# Patient Record
Sex: Male | Born: 1945 | Race: White | Hispanic: No | Marital: Married | Smoking: Former smoker
Health system: Southern US, Community
[De-identification: ages and names within clinical notes are randomized; demographics above are authoritative.]

## PROBLEM LIST (undated history)

## (undated) DIAGNOSIS — H3530 Unspecified macular degeneration: Secondary | ICD-10-CM

## (undated) DIAGNOSIS — J449 Chronic obstructive pulmonary disease, unspecified: Secondary | ICD-10-CM

## (undated) DIAGNOSIS — E782 Mixed hyperlipidemia: Secondary | ICD-10-CM

## (undated) DIAGNOSIS — I10 Essential (primary) hypertension: Secondary | ICD-10-CM

## (undated) DIAGNOSIS — M1990 Unspecified osteoarthritis, unspecified site: Secondary | ICD-10-CM

## (undated) DIAGNOSIS — H409 Unspecified glaucoma: Secondary | ICD-10-CM

## (undated) HISTORY — PX: COLONOSCOPY: SHX174

## (undated) HISTORY — PX: EYE SURGERY: SHX253

## (undated) HISTORY — PX: JOINT REPLACEMENT: SHX530

## (undated) HISTORY — DX: Unspecified glaucoma: H40.9

## (undated) HISTORY — PX: NASAL POLYP SURGERY: SHX186

---

## 2010-05-10 DIAGNOSIS — H3530 Unspecified macular degeneration: Secondary | ICD-10-CM

## 2010-05-10 HISTORY — DX: Unspecified macular degeneration: H35.30

## 2013-10-08 DIAGNOSIS — J449 Chronic obstructive pulmonary disease, unspecified: Secondary | ICD-10-CM

## 2013-10-08 HISTORY — DX: Chronic obstructive pulmonary disease, unspecified: J44.9

## 2013-10-19 ENCOUNTER — Other Ambulatory Visit (HOSPITAL_COMMUNITY): Payer: Self-pay | Admitting: Orthopaedic Surgery

## 2013-10-19 ENCOUNTER — Encounter (HOSPITAL_COMMUNITY): Payer: Self-pay | Admitting: Pharmacy Technician

## 2013-10-19 NOTE — Patient Instructions (Addendum)
Your procedure is scheduled on:  11/02/13  Friday  Report to [facility]- FOLLOW SIGNS TO SHORT STAY CENTER Short Stay Center at  0530      AM.   Call this number if you have problems the morning of surgery: [phone]     I AM AWARE I WILL HAVE LAB DRAW FOR BLOOD TYPING MORNING OF SURGERY   Do not eat food  Or drink :After Midnight. Thursday night   Take these medicines the morning of surgery with A SIP OF WATER:NONE   .  Contacts, dentures or partial plates, or metal hairpins  can not be worn to surgery. Your family will be responsible for glasses, dentures, hearing aides while you are in surgery  Leave suitcase in the car. After surgery it may be brought to your room.  For patients admitted to the hospital, checkout time is 11:00 AM day of  discharge.         [facility] IS NOT RESPONSIBLE FOR ANY VALUABLES                                                                  [facility] - Preparing for Surgery Before surgery, you can play an important role.  Because skin is not sterile, your skin needs to be as free of germs as possible.  You can reduce the number of germs on your skin by washing with CHG (chlorahexidine gluconate) soap before surgery.  CHG is an antiseptic cleaner which kills germs and bonds with the skin to continue killing germs even after washing. Please DO NOT use if you have an allergy to CHG or antibacterial soaps.  If your skin becomes reddened/irritated stop using the CHG and inform your nurse when you arrive at Short Stay. Do not shave (including legs and underarms) for at least 48 hours prior to the first CHG shower.  You may shave your face/neck. Please follow these instructions carefully:  1.  Shower with CHG Soap the night before surgery and the  morning of Surgery.  2.  If you choose to wash your hair, wash your hair first as usual with your  normal  shampoo.  3.  After you shampoo, rinse your hair and body thoroughly to remove  the  shampoo.                           4.  Use CHG as you would any other liquid soap.  You can apply chg directly  to the skin and wash                       Gently with a scrungie or clean washcloth.  5.  Apply the CHG Soap to your body ONLY FROM THE NECK DOWN.   Do not use on face/ open                           Wound or open sores. Avoid contact with eyes, ears mouth and genitals (private parts).                       Wash  face,  Genitals (private parts) with your normal soap.             6.  Wash thoroughly, paying special attention to the area where your surgery  will be performed.  7.  Thoroughly rinse your body with warm water from the neck down.  8.  DO NOT shower/wash with your normal soap after using and rinsing off  the CHG Soap.                9.  Pat yourself dry with a clean towel.            10.  Wear clean pajamas.            11.  Place clean sheets on your bed the night of your first shower and do not  sleep with pets. Day of Surgery : Do not apply any lotions/deodorants the morning of surgery.  Please wear clean clothes to the hospital/surgery center.  FAILURE TO FOLLOW THESE INSTRUCTIONS MAY RESULT IN THE CANCELLATION OF YOUR SURGERY PATIENT SIGNATURE_________________________________  NURSE SIGNATURE__________________________________  ________________________________________________________________________  WHAT IS A BLOOD TRANSFUSION? Blood Transfusion Information  A transfusion is the replacement of blood or some of its parts. Blood is made up of multiple cells which provide different functions.  Red blood cells carry oxygen and are used for blood loss replacement.  White blood cells fight against infection.  Platelets control bleeding.  Plasma helps clot blood.  Other blood products are available for specialized needs, such as hemophilia or other clotting disorders. BEFORE THE TRANSFUSION  Who gives blood for transfusions?   Healthy volunteers who are  fully evaluated to make sure their blood is safe. This is blood bank blood. Transfusion therapy is the safest it has ever been in the practice of medicine. Before blood is taken from a donor, a complete history is taken to make sure that person has no history of diseases nor engages in risky social behavior (examples are intravenous drug use or sexual activity with multiple partners). The donor's travel history is screened to minimize risk of transmitting infections, such as malaria. The donated blood is tested for signs of infectious diseases, such as HIV and hepatitis. The blood is then tested to be sure it is compatible with you in order to minimize the chance of a transfusion reaction. If you or a relative donates blood, this is often done in anticipation of surgery and is not appropriate for emergency situations. It takes many days to process the donated blood. RISKS AND COMPLICATIONS Although transfusion therapy is very safe and saves many lives, the main dangers of transfusion include:   Getting an infectious disease.  Developing a transfusion reaction. This is an allergic reaction to something in the blood you were given. Every precaution is taken to prevent this. The decision to have a blood transfusion has been considered carefully by your caregiver before blood is given. Blood is not given unless the benefits outweigh the risks. AFTER THE TRANSFUSION  Right after receiving a blood transfusion, you will usually feel much better and more energetic. This is especially true if your red blood cells have gotten low (anemic). The transfusion raises the level of the red blood cells which carry oxygen, and this usually causes an energy increase.  The nurse administering the transfusion will monitor you carefully for complications. HOME CARE INSTRUCTIONS  No special instructions are needed after a transfusion. You may find your energy is better. Speak with your caregiver about any limitations on  activity for underlying diseases you may have. SEEK MEDICAL CARE IF:   Your condition is not improving after your transfusion.  You develop redness or irritation at the intravenous (IV) site. SEEK IMMEDIATE MEDICAL CARE IF:  Any of the following symptoms occur over the next 12 hours:  Shaking chills.  You have a temperature by mouth above 102 F (38.9 C), not controlled by medicine.  Chest, back, or muscle pain.  People around you feel you are not acting correctly or are confused.  Shortness of breath or difficulty breathing.  Dizziness and fainting.  You get a rash or develop hives.  You have a decrease in urine output.  Your urine turns a dark color or changes to pink, red, or brown. Any of the following symptoms occur over the next 10 days:  You have a temperature by mouth above 102 F (38.9 C), not controlled by medicine.  Shortness of breath.  Weakness after normal activity.  The white part of the eye turns yellow (jaundice).  You have a decrease in the amount of urine or are urinating less often.  Your urine turns a dark color or changes to pink, red, or brown. Document Released: 04/23/2000 Document Revised: 07/19/2011 Document Reviewed: 12/11/2007 [facility] Patient Information 2014 [place], [place].  _______________________________________________________________________

## 2013-10-22 ENCOUNTER — Encounter (INDEPENDENT_AMBULATORY_CARE_PROVIDER_SITE_OTHER): Payer: Self-pay

## 2013-10-22 ENCOUNTER — Ambulatory Visit (HOSPITAL_COMMUNITY)
Admission: RE | Admit: 2013-10-22 | Discharge: 2013-10-22 | Disposition: A | Discharge status: Home / Self Care | Payer: Medicare Other | Source: Ambulatory Visit | Attending: Anesthesiology | Admitting: Anesthesiology

## 2013-10-22 ENCOUNTER — Encounter (HOSPITAL_COMMUNITY): Payer: Self-pay

## 2013-10-22 ENCOUNTER — Encounter (HOSPITAL_COMMUNITY)
Admission: RE | Admit: 2013-10-22 | Discharge: 2013-10-22 | Disposition: A | Discharge status: Home / Self Care | Payer: Medicare Other | Source: Ambulatory Visit | Attending: Orthopaedic Surgery | Admitting: Orthopaedic Surgery

## 2013-10-22 DIAGNOSIS — J8410 Pulmonary fibrosis, unspecified: Secondary | ICD-10-CM | POA: Insufficient documentation

## 2013-10-22 DIAGNOSIS — Z01810 Encounter for preprocedural cardiovascular examination: Secondary | ICD-10-CM | POA: Insufficient documentation

## 2013-10-22 DIAGNOSIS — J449 Chronic obstructive pulmonary disease, unspecified: Secondary | ICD-10-CM | POA: Insufficient documentation

## 2013-10-22 DIAGNOSIS — M47814 Spondylosis without myelopathy or radiculopathy, thoracic region: Secondary | ICD-10-CM | POA: Insufficient documentation

## 2013-10-22 DIAGNOSIS — J4489 Other specified chronic obstructive pulmonary disease: Secondary | ICD-10-CM | POA: Insufficient documentation

## 2013-10-22 DIAGNOSIS — Z01812 Encounter for preprocedural laboratory examination: Secondary | ICD-10-CM | POA: Insufficient documentation

## 2013-10-22 DIAGNOSIS — Z01818 Encounter for other preprocedural examination: Secondary | ICD-10-CM | POA: Insufficient documentation

## 2013-10-22 HISTORY — DX: Unspecified macular degeneration: H35.30

## 2013-10-22 HISTORY — DX: Chronic obstructive pulmonary disease, unspecified: J44.9

## 2013-10-22 HISTORY — DX: Mixed hyperlipidemia: E78.2

## 2013-10-22 HISTORY — DX: Unspecified osteoarthritis, unspecified site: M19.90

## 2013-10-22 HISTORY — DX: Essential (primary) hypertension: I10

## 2013-10-22 LAB — HEPATIC FUNCTION PANEL
ALBUMIN: 4.4 g/dL (ref 3.5–5.2)
ALK PHOS: 61 U/L (ref 39–117)
ALT: 25 U/L (ref 0–53)
AST: 33 U/L (ref 0–37)
BILIRUBIN TOTAL: 0.7 mg/dL (ref 0.3–1.2)
Total Protein: 7.9 g/dL (ref 6.0–8.3)

## 2013-10-22 LAB — URINALYSIS, ROUTINE W REFLEX MICROSCOPIC
Bilirubin Urine: NEGATIVE
GLUCOSE, UA: NEGATIVE mg/dL
Hgb urine dipstick: NEGATIVE
KETONES UR: NEGATIVE mg/dL
LEUKOCYTES UA: NEGATIVE
NITRITE: NEGATIVE
PH: 6 (ref 5.0–8.0)
PROTEIN: NEGATIVE mg/dL
Specific Gravity, Urine: 1.015 (ref 1.005–1.030)
Urobilinogen, UA: 1 mg/dL (ref 0.0–1.0)

## 2013-10-22 LAB — CBC
HCT: 44 % (ref 39.0–52.0)
HEMOGLOBIN: 14.9 g/dL (ref 13.0–17.0)
MCH: 31.4 pg (ref 26.0–34.0)
MCHC: 33.9 g/dL (ref 30.0–36.0)
MCV: 92.8 fL (ref 78.0–100.0)
Platelets: 348 10*3/uL (ref 150–400)
RBC: 4.74 MIL/uL (ref 4.22–5.81)
RDW: 12.3 % (ref 11.5–15.5)
WBC: 8.3 10*3/uL (ref 4.0–10.5)

## 2013-10-22 LAB — BASIC METABOLIC PANEL
BUN: 11 mg/dL (ref 6–23)
CO2: 28 mEq/L (ref 19–32)
Calcium: 9.8 mg/dL (ref 8.4–10.5)
Chloride: 96 mEq/L (ref 96–112)
Creatinine, Ser: 0.83 mg/dL (ref 0.50–1.35)
GFR calc non Af Amer: 89 mL/min — ABNORMAL LOW (ref >90)
Glucose, Bld: 101 mg/dL — ABNORMAL HIGH (ref 70–99)
POTASSIUM: 4.3 meq/L (ref 3.7–5.3)
Sodium: 137 mEq/L (ref 137–147)

## 2013-10-22 LAB — APTT: aPTT: 61 seconds — ABNORMAL HIGH (ref 24–37)

## 2013-10-22 LAB — SURGICAL PCR SCREEN
MRSA, PCR: NEGATIVE
Staphylococcus aureus: NEGATIVE

## 2013-10-22 LAB — PROTIME-INR
INR: 0.98 (ref 0.00–1.49)
Prothrombin Time: 12.8 seconds (ref 11.6–15.2)

## 2013-10-22 NOTE — Progress Notes (Signed)
LOV Dr Duane LopeAlan Ross  12/14 chart

## 2013-10-22 NOTE — Progress Notes (Signed)
10/22/13 1353  OBSTRUCTIVE SLEEP APNEA  Have you ever been diagnosed with sleep apnea through a sleep study? No  Do you snore loudly (loud enough to be heard through closed doors)?  1  Do you often feel tired, fatigued, or sleepy during the daytime? 0  Has anyone observed you stop breathing during your sleep? 0  Do you have, or are you being treated for high blood pressure? 1  BMI more than 35 kg/m2? 0  Age over 67 years old? 1  Neck circumference greater than 40 cm/16 inches? 0  Gender: 1  Obstructive Sleep Apnea Score 4  Score 4 or greater  Results sent to PCP

## 2013-10-23 ENCOUNTER — Encounter (HOSPITAL_COMMUNITY): Payer: Self-pay

## 2013-11-02 ENCOUNTER — Inpatient Hospital Stay (HOSPITAL_COMMUNITY): Payer: Medicare Other

## 2013-11-02 ENCOUNTER — Inpatient Hospital Stay (HOSPITAL_COMMUNITY): Payer: Medicare Other | Admitting: Registered Nurse

## 2013-11-02 ENCOUNTER — Encounter (HOSPITAL_COMMUNITY): Payer: Self-pay | Admitting: *Deleted

## 2013-11-02 ENCOUNTER — Inpatient Hospital Stay (HOSPITAL_COMMUNITY)
Admission: RE | Admit: 2013-11-02 | Discharge: 2013-11-04 | DRG: 470 | Disposition: A | Discharge status: Home Health | Payer: Medicare Other | Source: Ambulatory Visit | Attending: Orthopaedic Surgery | Admitting: Orthopaedic Surgery

## 2013-11-02 ENCOUNTER — Encounter (HOSPITAL_COMMUNITY): Payer: Medicare Other | Admitting: Registered Nurse

## 2013-11-02 ENCOUNTER — Encounter (HOSPITAL_COMMUNITY)
Admission: RE | Disposition: A | Discharge status: Home Health | Payer: Self-pay | Source: Ambulatory Visit | Attending: Orthopaedic Surgery

## 2013-11-02 DIAGNOSIS — M1612 Unilateral primary osteoarthritis, left hip: Secondary | ICD-10-CM

## 2013-11-02 DIAGNOSIS — Z96649 Presence of unspecified artificial hip joint: Secondary | ICD-10-CM

## 2013-11-02 DIAGNOSIS — M169 Osteoarthritis of hip, unspecified: Secondary | ICD-10-CM | POA: Diagnosis not present

## 2013-11-02 DIAGNOSIS — E782 Mixed hyperlipidemia: Secondary | ICD-10-CM | POA: Diagnosis present

## 2013-11-02 DIAGNOSIS — M1610 Unilateral primary osteoarthritis, unspecified hip: Principal | ICD-10-CM | POA: Diagnosis present

## 2013-11-02 DIAGNOSIS — J4489 Other specified chronic obstructive pulmonary disease: Secondary | ICD-10-CM | POA: Diagnosis present

## 2013-11-02 DIAGNOSIS — I10 Essential (primary) hypertension: Secondary | ICD-10-CM | POA: Diagnosis not present

## 2013-11-02 DIAGNOSIS — Z87891 Personal history of nicotine dependence: Secondary | ICD-10-CM | POA: Diagnosis not present

## 2013-11-02 DIAGNOSIS — J449 Chronic obstructive pulmonary disease, unspecified: Secondary | ICD-10-CM | POA: Diagnosis present

## 2013-11-02 DIAGNOSIS — H3530 Unspecified macular degeneration: Secondary | ICD-10-CM | POA: Diagnosis present

## 2013-11-02 HISTORY — PX: TOTAL HIP ARTHROPLASTY: SHX124

## 2013-11-02 LAB — TYPE AND SCREEN
ABO/RH(D): A NEG
Antibody Screen: NEGATIVE

## 2013-11-02 LAB — ABO/RH: ABO/RH(D): A NEG

## 2013-11-02 SURGERY — ARTHROPLASTY, HIP, TOTAL, ANTERIOR APPROACH
Anesthesia: General | Site: Hip | Laterality: Left

## 2013-11-02 MED ORDER — ASPIRIN EC 325 MG PO TBEC
325.0000 mg | DELAYED_RELEASE_TABLET | Freq: Two times a day (BID) | ORAL | Status: DC
  Administered 2013-11-02 – 2013-11-04 (×4): 325 mg via ORAL
  Filled 2013-11-02 (×6): qty 1

## 2013-11-02 MED ORDER — SODIUM CHLORIDE 0.9 % IR SOLN
Status: DC | PRN
  Administered 2013-11-02: 1000 mL

## 2013-11-02 MED ORDER — PROPOFOL 10 MG/ML IV BOLUS
INTRAVENOUS | Status: AC
  Filled 2013-11-02: qty 20

## 2013-11-02 MED ORDER — METOCLOPRAMIDE HCL 10 MG PO TABS
5.0000 mg | ORAL_TABLET | Freq: Three times a day (TID) | ORAL | Status: DC | PRN
Start: 2013-11-02 — End: 2013-11-04
  Administered 2013-11-03: 10 mg via ORAL
  Filled 2013-11-02: qty 1

## 2013-11-02 MED ORDER — DIPHENHYDRAMINE HCL 12.5 MG/5ML PO ELIX: 12.5000 mg | ORAL_SOLUTION | ORAL | Status: DC | PRN

## 2013-11-02 MED ORDER — ALUM & MAG HYDROXIDE-SIMETH 200-200-20 MG/5ML PO SUSP
30.0000 mL | ORAL | Status: DC | PRN
  Administered 2013-11-03: 30 mL via ORAL
  Filled 2013-11-02: qty 30

## 2013-11-02 MED ORDER — POLYETHYLENE GLYCOL 3350 17 G PO PACK
17.0000 g | PACK | Freq: Every day | ORAL | Status: DC | PRN
  Filled 2013-11-02: qty 1

## 2013-11-02 MED ORDER — DEXAMETHASONE SODIUM PHOSPHATE 10 MG/ML IJ SOLN
INTRAMUSCULAR | Status: AC
  Filled 2013-11-02: qty 1

## 2013-11-02 MED ORDER — ACETAMINOPHEN 10 MG/ML IV SOLN
1000.0000 mg | Freq: Once | INTRAVENOUS | Status: AC
  Administered 2013-11-02: 1000 mg via INTRAVENOUS
  Filled 2013-11-02 (×2): qty 100

## 2013-11-02 MED ORDER — ONDANSETRON HCL 4 MG/2ML IJ SOLN
4.0000 mg | Freq: Four times a day (QID) | INTRAMUSCULAR | Status: DC | PRN
  Administered 2013-11-02 – 2013-11-03 (×2): 4 mg via INTRAVENOUS
  Filled 2013-11-02 (×2): qty 2

## 2013-11-02 MED ORDER — PHENOL 1.4 % MT LIQD: 1.0000 | OROMUCOSAL | Status: DC | PRN

## 2013-11-02 MED ORDER — TRANEXAMIC ACID 100 MG/ML IV SOLN
1000.0000 mg | INTRAVENOUS | Status: AC
  Administered 2013-11-02: 1000 mg via INTRAVENOUS
  Filled 2013-11-02: qty 10

## 2013-11-02 MED ORDER — PROPOFOL 10 MG/ML IV BOLUS
INTRAVENOUS | Status: DC | PRN
  Administered 2013-11-02 (×2): 50 mg via INTRAVENOUS

## 2013-11-02 MED ORDER — METHOCARBAMOL 500 MG PO TABS
500.0000 mg | ORAL_TABLET | Freq: Four times a day (QID) | ORAL | Status: DC | PRN
  Administered 2013-11-03 – 2013-11-04 (×3): 500 mg via ORAL
  Filled 2013-11-02 (×3): qty 1

## 2013-11-02 MED ORDER — HYDROMORPHONE HCL PF 1 MG/ML IJ SOLN
1.0000 mg | INTRAMUSCULAR | Status: DC | PRN
  Administered 2013-11-02 (×2): 1 mg via INTRAVENOUS
  Filled 2013-11-02 (×2): qty 1

## 2013-11-02 MED ORDER — ACETAMINOPHEN 325 MG PO TABS: 650.0000 mg | ORAL_TABLET | Freq: Four times a day (QID) | ORAL | Status: DC | PRN

## 2013-11-02 MED ORDER — 0.9 % SODIUM CHLORIDE (POUR BTL) OPTIME
TOPICAL | Status: DC | PRN
  Administered 2013-11-02: 1000 mL

## 2013-11-02 MED ORDER — HYDROCHLOROTHIAZIDE 25 MG PO TABS
25.0000 mg | ORAL_TABLET | Freq: Every day | ORAL | Status: DC
  Administered 2013-11-02 – 2013-11-04 (×3): 25 mg via ORAL
  Filled 2013-11-02 (×3): qty 1

## 2013-11-02 MED ORDER — MEPERIDINE HCL 50 MG/ML IJ SOLN
INTRAMUSCULAR | Status: AC
  Filled 2013-11-02: qty 1

## 2013-11-02 MED ORDER — CEFAZOLIN SODIUM-DEXTROSE 2-3 GM-% IV SOLR
2.0000 g | INTRAVENOUS | Status: AC
  Administered 2013-11-02: 2 g via INTRAVENOUS

## 2013-11-02 MED ORDER — OXYCODONE HCL 5 MG PO TABS: 5.0000 mg | ORAL_TABLET | Freq: Once | ORAL | Status: DC | PRN

## 2013-11-02 MED ORDER — SODIUM CHLORIDE 0.9 % IV SOLN
INTRAVENOUS | Status: DC
  Administered 2013-11-02 – 2013-11-03 (×2): via INTRAVENOUS

## 2013-11-02 MED ORDER — FENTANYL CITRATE 0.05 MG/ML IJ SOLN
INTRAMUSCULAR | Status: AC
  Filled 2013-11-02: qty 2

## 2013-11-02 MED ORDER — ACETAMINOPHEN 650 MG RE SUPP: 650.0000 mg | Freq: Four times a day (QID) | RECTAL | Status: DC | PRN

## 2013-11-02 MED ORDER — METHOCARBAMOL 1000 MG/10ML IJ SOLN
500.0000 mg | Freq: Four times a day (QID) | INTRAVENOUS | Status: DC | PRN
  Administered 2013-11-02: 500 mg via INTRAVENOUS
  Filled 2013-11-02: qty 5

## 2013-11-02 MED ORDER — MIDAZOLAM HCL 5 MG/5ML IJ SOLN
INTRAMUSCULAR | Status: DC | PRN
  Administered 2013-11-02 (×2): 2 mg via INTRAVENOUS

## 2013-11-02 MED ORDER — MIDAZOLAM HCL 2 MG/2ML IJ SOLN
INTRAMUSCULAR | Status: AC
  Filled 2013-11-02: qty 2

## 2013-11-02 MED ORDER — DOCUSATE SODIUM 100 MG PO CAPS
100.0000 mg | ORAL_CAPSULE | Freq: Two times a day (BID) | ORAL | Status: DC
  Administered 2013-11-02 – 2013-11-04 (×5): 100 mg via ORAL
  Filled 2013-11-02 (×6): qty 1

## 2013-11-02 MED ORDER — PROMETHAZINE HCL 25 MG/ML IJ SOLN: 6.2500 mg | INTRAMUSCULAR | Status: DC | PRN

## 2013-11-02 MED ORDER — BUPIVACAINE HCL (PF) 0.5 % IJ SOLN
INTRAMUSCULAR | Status: DC | PRN
  Administered 2013-11-02: 3 mL

## 2013-11-02 MED ORDER — OXYCODONE HCL 5 MG/5ML PO SOLN
5.0000 mg | Freq: Once | ORAL | Status: DC | PRN
  Filled 2013-11-02: qty 5

## 2013-11-02 MED ORDER — LACTATED RINGERS IV SOLN
INTRAVENOUS | Status: DC | PRN
  Administered 2013-11-02 (×3): via INTRAVENOUS

## 2013-11-02 MED ORDER — ZOLPIDEM TARTRATE 5 MG PO TABS: 5.0000 mg | ORAL_TABLET | Freq: Every evening | ORAL | Status: DC | PRN

## 2013-11-02 MED ORDER — HYDROMORPHONE HCL PF 1 MG/ML IJ SOLN: 0.2500 mg | INTRAMUSCULAR | Status: DC | PRN

## 2013-11-02 MED ORDER — LOSARTAN POTASSIUM-HCTZ 100-25 MG PO TABS: 1.0000 | ORAL_TABLET | Freq: Every morning | ORAL | Status: DC

## 2013-11-02 MED ORDER — PHENYLEPHRINE HCL 10 MG/ML IJ SOLN
10.0000 mg | INTRAVENOUS | Status: DC | PRN
  Administered 2013-11-02: 15 ug/min via INTRAVENOUS

## 2013-11-02 MED ORDER — CEFAZOLIN SODIUM-DEXTROSE 2-3 GM-% IV SOLR
INTRAVENOUS | Status: AC
  Filled 2013-11-02: qty 50

## 2013-11-02 MED ORDER — MENTHOL 3 MG MT LOZG: 1.0000 | LOZENGE | OROMUCOSAL | Status: DC | PRN

## 2013-11-02 MED ORDER — CEFAZOLIN SODIUM 1-5 GM-% IV SOLN
1.0000 g | Freq: Four times a day (QID) | INTRAVENOUS | Status: AC
  Administered 2013-11-02 (×2): 1 g via INTRAVENOUS
  Filled 2013-11-02 (×2): qty 50

## 2013-11-02 MED ORDER — PROPOFOL INFUSION 10 MG/ML OPTIME
INTRAVENOUS | Status: DC | PRN
  Administered 2013-11-02: 100 ug/kg/min via INTRAVENOUS

## 2013-11-02 MED ORDER — FENTANYL CITRATE 0.05 MG/ML IJ SOLN
INTRAMUSCULAR | Status: DC | PRN
  Administered 2013-11-02: 100 ug via INTRAVENOUS

## 2013-11-02 MED ORDER — PHENYLEPHRINE HCL 10 MG/ML IJ SOLN
INTRAMUSCULAR | Status: DC | PRN
  Administered 2013-11-02 (×2): 120 ug via INTRAVENOUS

## 2013-11-02 MED ORDER — OXYCODONE HCL 5 MG PO TABS
5.0000 mg | ORAL_TABLET | ORAL | Status: DC | PRN
  Administered 2013-11-02 – 2013-11-04 (×11): 10 mg via ORAL
  Filled 2013-11-02 (×11): qty 2

## 2013-11-02 MED ORDER — MEPERIDINE HCL 50 MG/ML IJ SOLN
6.2500 mg | INTRAMUSCULAR | Status: DC | PRN
Start: 2013-11-02 — End: 2013-11-02
  Administered 2013-11-02 (×2): 6.25 mg via INTRAVENOUS

## 2013-11-02 MED ORDER — LOSARTAN POTASSIUM 50 MG PO TABS
100.0000 mg | ORAL_TABLET | Freq: Every day | ORAL | Status: DC
  Administered 2013-11-02 – 2013-11-04 (×3): 100 mg via ORAL
  Filled 2013-11-02 (×3): qty 2

## 2013-11-02 MED ORDER — ONDANSETRON HCL 4 MG PO TABS: 4.0000 mg | ORAL_TABLET | Freq: Four times a day (QID) | ORAL | Status: DC | PRN

## 2013-11-02 MED ORDER — METOCLOPRAMIDE HCL 5 MG/ML IJ SOLN
5.0000 mg | Freq: Three times a day (TID) | INTRAMUSCULAR | Status: DC | PRN
  Administered 2013-11-02: 10 mg via INTRAVENOUS
  Filled 2013-11-02: qty 2

## 2013-11-02 SURGICAL SUPPLY — 47 items
APL SKNCLS STERI-STRIP NONHPOA (GAUZE/BANDAGES/DRESSINGS) ×1
BAG SPEC THK2 15X12 ZIP CLS (MISCELLANEOUS)
BAG ZIPLOCK 12X15 (MISCELLANEOUS) IMPLANT
BENZOIN TINCTURE PRP APPL 2/3 (GAUZE/BANDAGES/DRESSINGS) ×1 IMPLANT
BLADE SAW SGTL 18X1.27X75 (BLADE) ×2 IMPLANT
CAPT HIP PF COP ×1 IMPLANT
CELLS DAT CNTRL 66122 CELL SVR (MISCELLANEOUS) ×1 IMPLANT
COVER PERINEAL POST (MISCELLANEOUS) ×2 IMPLANT
DRAPE C-ARM 42X120 X-RAY (DRAPES) ×2 IMPLANT
DRAPE STERI IOBAN 125X83 (DRAPES) ×2 IMPLANT
DRAPE U-SHAPE 47X51 STRL (DRAPES) ×6 IMPLANT
DRSG AQUACEL AG ADV 3.5X10 (GAUZE/BANDAGES/DRESSINGS) ×2 IMPLANT
DURAPREP 26ML APPLICATOR (WOUND CARE) ×2 IMPLANT
ELECT BLADE TIP CTD 4 INCH (ELECTRODE) ×2 IMPLANT
ELECT REM PT RETURN 9FT ADLT (ELECTROSURGICAL) ×2
ELECTRODE REM PT RTRN 9FT ADLT (ELECTROSURGICAL) ×1 IMPLANT
FACESHIELD WRAPAROUND (MASK) ×6 IMPLANT
FACESHIELD WRAPAROUND OR TEAM (MASK) ×4 IMPLANT
GAUZE XEROFORM 1X8 LF (GAUZE/BANDAGES/DRESSINGS) IMPLANT
GLOVE BIO SURGEON STRL SZ7.5 (GLOVE) ×2 IMPLANT
GLOVE BIO SURGEON STRL SZ8 (GLOVE) ×1 IMPLANT
GLOVE BIOGEL PI IND STRL 8 (GLOVE) ×2 IMPLANT
GLOVE BIOGEL PI IND STRL 8.5 (GLOVE) IMPLANT
GLOVE BIOGEL PI INDICATOR 8 (GLOVE) ×3
GLOVE BIOGEL PI INDICATOR 8.5 (GLOVE) ×1
GLOVE ECLIPSE 7.5 STRL STRAW (GLOVE) ×1 IMPLANT
GLOVE ECLIPSE 8.0 STRL XLNG CF (GLOVE) ×2 IMPLANT
GOWN SPEC L3 XXLG W/TWL (GOWN DISPOSABLE) ×1 IMPLANT
GOWN STRL REUS W/TWL XL LVL3 (GOWN DISPOSABLE) ×5 IMPLANT
HANDPIECE INTERPULSE COAX TIP (DISPOSABLE) ×2
HOLDER FOLEY CATH W/STRAP (MISCELLANEOUS) ×1 IMPLANT
KIT BASIN OR (CUSTOM PROCEDURE TRAY) ×2 IMPLANT
PACK TOTAL JOINT (CUSTOM PROCEDURE TRAY) ×2 IMPLANT
RETRACTOR WND ALEXIS 18 MED (MISCELLANEOUS) ×1 IMPLANT
RTRCTR WOUND ALEXIS 18CM MED (MISCELLANEOUS) ×2
SET HNDPC FAN SPRY TIP SCT (DISPOSABLE) ×1 IMPLANT
STAPLER VISISTAT 35W (STAPLE) IMPLANT
STRIP CLOSURE SKIN 1/2X4 (GAUZE/BANDAGES/DRESSINGS) ×1 IMPLANT
SUT ETHIBOND NAB CT1 #1 30IN (SUTURE) ×2 IMPLANT
SUT MNCRL AB 4-0 PS2 18 (SUTURE) ×2 IMPLANT
SUT VIC AB 0 CT1 36 (SUTURE) ×2 IMPLANT
SUT VIC AB 1 CT1 36 (SUTURE) ×2 IMPLANT
SUT VIC AB 2-0 CT1 27 (SUTURE) ×4
SUT VIC AB 2-0 CT1 TAPERPNT 27 (SUTURE) ×2 IMPLANT
TOWEL OR 17X26 10 PK STRL BLUE (TOWEL DISPOSABLE) ×2 IMPLANT
TOWEL OR NON WOVEN STRL DISP B (DISPOSABLE) ×1 IMPLANT
TRAY FOLEY CATH 16FRSI W/METER (SET/KITS/TRAYS/PACK) ×1 IMPLANT

## 2013-11-02 NOTE — Anesthesia Preprocedure Evaluation (Addendum)
Anesthesia Evaluation  Patient identified by MRN, date of birth, ID band Patient awake    Reviewed: Allergy & Precautions, H&P , NPO status , Patient's Chart, lab work & pertinent test results  Airway Mallampati: II TM Distance: >3 FB Neck ROM: Full    Dental  (+) Dental Advisory Given   Pulmonary COPDformer smoker,  breath sounds clear to auscultation        Cardiovascular hypertension, Pt. on medications Rhythm:Regular Rate:Normal     Neuro/Psych negative neurological ROS  negative psych ROS   GI/Hepatic negative GI ROS, Neg liver ROS,   Endo/Other  negative endocrine ROS  Renal/GU negative Renal ROS     Musculoskeletal negative musculoskeletal ROS (+)   Abdominal   Peds  Hematology negative hematology ROS (+)   Anesthesia Other Findings   Reproductive/Obstetrics                          Anesthesia Physical Anesthesia Plan  ASA: III  Anesthesia Plan: Spinal   Post-op Pain Management:    Induction:   Airway Management Planned:   Additional Equipment:   Intra-op Plan:   Post-operative Plan:   Informed Consent: I have reviewed the patients History and Physical, chart, labs and discussed the procedure including the risks, benefits and alternatives for the proposed anesthesia with the patient or authorized representative who has indicated his/her understanding and acceptance.   Dental advisory given  Plan Discussed with: CRNA  Anesthesia Plan Comments: (Discussed spinal risk with patient. Isolated prolonged PTT. Other coags WNL. Discussed minimal risk of bleeding. Pt accepts and elects spinal.)      Anesthesia Quick Evaluation

## 2013-11-02 NOTE — Anesthesia Postprocedure Evaluation (Signed)
Anesthesia Post Note  Patient: Juan GalloFred D [place]  Procedure(s) Performed: Procedure(s) (LRB): LEFT TOTAL HIP ARTHROPLASTY ANTERIOR APPROACH (Left)  Anesthesia type: Spinal  Patient location: PACU  Post pain: Pain level controlled  Post assessment: Post-op Vital signs reviewed  Last Vitals: BP 130/87  Pulse 72  Temp(Src) 36.5 C (Oral)  Resp 16  Ht 6' (1.829 m)  Wt 256 lb (116.121 kg)  BMI 34.71 kg/m2  SpO2 97%  Post vital signs: Reviewed  Level of consciousness: sedated  Complications: No apparent anesthesia complications

## 2013-11-02 NOTE — Brief Op Note (Signed)
11/02/2013  9:04 AM  PATIENT:  Franchot GalloFred D Lebon  68 y.o. male  PRE-OPERATIVE DIAGNOSIS:  Severe osteoarthritis left hip  POST-OPERATIVE DIAGNOSIS:  Severe osteoarthritis left hip  PROCEDURE:  Procedure(s): LEFT TOTAL HIP ARTHROPLASTY ANTERIOR APPROACH (Left)  SURGEON:  Surgeon(s) and Role:    * Kathryne Hitchhristopher Y Edoardo Laforte, MD - Primary  PHYSICIAN ASSISTANT: Rexene EdisonGil Clark, PA-C  ANESTHESIA:   spinal  EBL:  Total I/O In: 1000 [I.V.:1000] Out: -   BLOOD ADMINISTERED:none  COUNTS:  YES  TOURNIQUET:  * No tourniquets in log *  DICTATION: .Other Dictation: Dictation Number [id number]  PLAN OF CARE: Admit to inpatient   PATIENT DISPOSITION:  PACU - hemodynamically stable.   Delay start of Pharmacological VTE agent (>24hrs) due to surgical blood loss or risk of bleeding: no

## 2013-11-02 NOTE — Op Note (Signed)
NAME:  Juan Reed, [place]                    ACCOUNT NO.:  [id number]  MEDICAL RECORD NO.:  [id number]  LOCATION:  [facility]                         [facility]  PHYSICIAN:  Juan Reed, M.D.DATE OF BIRTH:  1945-07-10  DATE OF PROCEDURE:  11/02/2013 DATE OF DISCHARGE:                              OPERATIVE REPORT   PREOPERATIVE DIAGNOSIS:  Severe end-stage arthritis and degenerative joint disease, left hip.  POSTOPERATIVE DIAGNOSIS:  Severe end-stage arthritis and degenerative joint disease, left hip.  PROCEDURE:  Left total hip arthroplasty through direct anterior approach.  IMPLANTS:  DePuy Sector Gription acetabular component size 56 with apex hole eliminator guide, size 36+ 4 neutral polyethylene liners, size 14 Corail femoral component with varus offset (KLA), size 36+ 5 ceramic hip ball.  SURGEON:  Juan PandaChristopher Y. Magnus IvanBlackman, MD  ASSISTANT:  Richardean CanalGilbert Clark, PA-C  ANESTHESIA:  Spinal.  ANTIBIOTICS:  2 g IV Ancef.  BLOOD LOSS:  Less than 500 mL.  COMPLICATIONS:  None.  INDICATIONS:  Juan Reed is a 68 year old gentleman with debilitating arthritis involving his right hip.  He has known this for some years now and it has gotten where it affects his activities of daily living.  His mobility is limited, and now it is impacting his quality of life.  He has failed conservative treatment efforts and at this point, he wishes to proceed with a total hip arthroplasty.  He understands the risks of acute blood loss anemia, nerve and vessel injury, fracture, infection, dislocation, DVT.  He understands the goals are decreased pain, improved mobility, and overall improved quality of life.  PROCEDURE DESCRIPTION:  After informed consent was obtained, appropriate left hip was marked.  He was brought to the operating room.  Spinal anesthesia was obtained while he was on a stretcher.  He was then placed in supine position on a stretcher with a Foley catheter placed and then in both  feet, had traction boots applied to them.  Next, he was placed supine on the Hana fracture table with the perineal post in place and both legs in inline skeletal traction devices, but no traction applied. His left operative hip was prepped and draped with DuraPrep and sterile drapes.  A time-out was called and identified as correct patient, correct left hip.  We then made an incision inferior and posterior to the lateral anterior superior iliac spine and carried this obliquely down the leg.  I dissected down to the tensor fascia lata muscle and the tensor fascia was divided longitudinally so we could proceed with a direct anterior approach to the hip.  We cauterized the lateral femoral circumflex vessels and then placed Cobra retractors around the lateral neck and up underneath the rectus femoris around the medial neck.  We opened up the hip capsule in an  L-type format and found a large effusion and significant osteophytes around the femoral head and neck. We placed the Cobra retractors within the hip capsule.  We then made our femoral neck cut with an oscillating saw just proximal to the lesser trochanter and completed this on osteotome.  I placed a corkscrew guide in the femoral head and removed the femoral head in its  entirety and found to be devoid of cartilage.  We cleaned the acetabulum debris including remnants of acetabular labrum.  I placed a bent Hohmann medially and a Cobra retractor laterally.  We then began reaming from a size 42 reamer in 2 mm increments all the way up to a size 56 with all reamers placed under direct visualization and the last 2 reamers under direct fluoroscopy, so we could obtain our depth of reamer, our inclination and anteversion.  Once we are pleased with this, we placed the real DePuy Sector Gription acetabular component size 56.  An apex hole eliminator guide and a 36+ 4 neutral polyethylene liner for size 56 acetabular component.  Attention was then  turned to the femur with the leg externally rotated to 100 degrees extended and adducted.  We had to place a Mueller retractor medially and Hohmann retractor behind the greater trochanter.  I released the lateral joint capsule and then used a box cutting osteotome to enter the femoral canal and a rongeur to lateralize.  We then began broaching from a size 8 broach using the Corail broaching system all the way up to a size 14.  With a 14 in place, we trialed a KLA neck as a varus offset neck based on his anatomy and a 36+ 1.5 hip ball.  We reduced the hip into the acetabulum and it was stable, but he was slightly short.  We then removed all instrumentation and all trial components.  We placed the real Corail femoral component size 14 with varus offset and then a 36 +5 ceramic hip ball, reduced back in the acetabulum and was stable throughout its arc of rotation with minimal shuck.  His offset and leg lengths were measured near equal under direct fluoroscopy.  We then copiously irrigated the joint capsule and the joint with normal saline solution using pulsatile lavage.  We closed the joint capsule with interrupted #1 Ethibond suture followed by running #1 Vicryl in the tensor fascia, 0 Vicryl in deep tissue, 2-0 Vicryl in subcutaneous tissue, 4-0 Monocryl in subcuticular stitch and Steri-Strips on the skin.  An Aquacel dressing was applied.  He was taken off of the Hana table into the recovery room in stable condition.  All final counts were correct and no complications noted.  Of note, Richardean CanalGilbert Clark, PA-C assisted in the entire case.  His assistance was crucial from the beginning of the case to the end with retracting, and layered closure of the wound.     Juan Pandahristopher Y. Magnus Reed, M.D.     CYB/MEDQ  D:  11/02/2013  T:  11/02/2013  Job:  [id number]

## 2013-11-02 NOTE — Evaluation (Signed)
Physical Therapy Evaluation Patient Details Name: Juan Reed MRN: [id number] DOB: 10-15-1945 Today's Date: 11/02/2013   History of Present Illness     Clinical Impression  Pt s/p L THR presents with decreased L LE strength/ROM and post op pain limiting functional mobility.  Pt should progress to d/c home with family assist and HHPT follow up.    Follow Up Recommendations Home health PT    Equipment Recommendations  Rolling walker with 5" wheels    Recommendations for Other Services OT consult     Precautions / Restrictions Precautions Precautions: Fall Restrictions Weight Bearing Restrictions: No Other Position/Activity Restrictions: WBAT      Mobility  Bed Mobility Overal bed mobility: Needs Assistance Bed Mobility: Supine to Sit     Supine to sit: Min assist;Mod assist     General bed mobility comments: cues for sequence and use of R LE to self assist  Transfers Overall transfer level: Needs assistance Equipment used: Rolling walker (2 wheeled) Transfers: Sit to/from Stand Sit to Stand: Min assist;Mod assist         General transfer comment: cues for LE management and use of UEs to self assist  Ambulation/Gait Ambulation/Gait assistance: Min assist Ambulation Distance (Feet): 65 Feet Assistive device: Rolling walker (2 wheeled) Gait Pattern/deviations: Step-to pattern;Decreased step length - right;Decreased step length - left;Shuffle;Trunk flexed Gait velocity: mod   General Gait Details: cues for posture, position from RW and initial sequence  Stairs            Wheelchair Mobility    Modified Rankin (Stroke Patients Only)       Balance                                             Pertinent Vitals/Pain 6/10; premed, ice packs provided    Home Living Family/patient expects to be discharged to:: Private residence Living Arrangements: Spouse/significant other Available Help at Discharge: Family Type of Home:  House Home Access: Stairs to enter Entrance Stairs-Rails: Doctor, general practiceight;Left Entrance Stairs-Number of Steps: 7 Home Layout: One level Home Equipment: Cane - single point      Prior Function Level of Independence: Independent;Independent with assistive device(s)         Comments: Used cane as needed     Hand Dominance   Dominant Hand: Right    Extremity/Trunk Assessment   Upper Extremity Assessment: Overall WFL for tasks assessed           Lower Extremity Assessment: LLE deficits/detail   LLE Deficits / Details: Hip strength 2+/5 wtih AAROM at hip to 75 flex and 15 abd  Cervical / Trunk Assessment: Normal  Communication   Communication: No difficulties  Cognition Arousal/Alertness: Awake/alert Behavior During Therapy: WFL for tasks assessed/performed Overall Cognitive Status: Within Functional Limits for tasks assessed                      General Comments      Exercises Total Joint Exercises Ankle Circles/Pumps: AROM;Both;15 reps;Supine Quad Sets: AROM;Both;10 reps;Supine Heel Slides: AAROM;Left;15 reps;Supine Hip ABduction/ADduction: AAROM;Left;10 reps;Supine      Assessment/Plan    PT Assessment Patient needs continued PT services  PT Diagnosis Difficulty walking   PT Problem List Decreased strength;Decreased range of motion;Decreased activity tolerance;Decreased mobility;Decreased knowledge of use of DME;Pain;Obesity  PT Treatment Interventions DME instruction;Gait training;Stair training;Functional mobility training;Therapeutic activities;Therapeutic exercise;Patient/family education  PT Goals (Current goals can be found in the Care Plan section) Acute Rehab PT Goals Patient Stated Goal: Resume previous lifestyle with decreased pain PT Goal Formulation: With patient Time For Goal Achievement: 11/09/13 Potential to Achieve Goals: Good    Frequency 7X/week   Barriers to discharge        Co-evaluation               End of Session  Equipment Utilized During Treatment: Gait belt Activity Tolerance: Patient tolerated treatment well Patient left: in chair;with call bell/phone within reach Nurse Communication: Mobility status         Time: [id number] PT Time Calculation (min): 37 min   Charges:   PT Evaluation $Initial PT Evaluation Tier I: 1 Procedure PT Treatments $Gait Training: 8-22 mins $Therapeutic Exercise: 8-22 mins   PT G Codes:          Odella Appelhans 11/02/2013, 4:06 PM

## 2013-11-02 NOTE — Anesthesia Procedure Notes (Signed)
Spinal  Patient location during procedure: OR Staffing Anesthesiologist: Nolon Nations R Performed by: anesthesiologist  Preanesthetic Checklist Completed: patient identified, site marked, surgical consent, pre-op evaluation, timeout performed, IV checked, risks and benefits discussed and monitors and equipment checked Spinal Block Patient position: sitting Prep: Betadine Patient monitoring: heart rate, continuous pulse ox and blood pressure Approach: left paramedian Location: L2-3 Injection technique: single-shot Needle Needle type: Quincke  Needle gauge: 22 G Needle length: 9 cm Additional Notes Expiration date of kit checked and confirmed. Patient tolerated procedure well, without complications.

## 2013-11-02 NOTE — Transfer of Care (Signed)
Immediate Anesthesia Transfer of Care Note  Patient: Juan GalloFred D Reed  Procedure(s) Performed: Procedure(s): LEFT TOTAL HIP ARTHROPLASTY ANTERIOR APPROACH (Left)  Patient Location: PACU  Anesthesia Type:Spinal  Level of Consciousness: awake, alert , oriented and patient cooperative  Airway & Oxygen Therapy: Patient Spontanous Breathing and Patient connected to face mask oxygen  Post-op Assessment: Report given to PACU RN and Post -op Vital signs reviewed and stable  Post vital signs: stable  Complications: No apparent anesthesia complications  T12 spinal level

## 2013-11-02 NOTE — Progress Notes (Signed)
Utilization review completed.  

## 2013-11-02 NOTE — Progress Notes (Signed)
Physical Therapy Treatment Patient Details Name: Juan MiniumFred D Reed MRN: [id number] DOB: 06-22-1945 Today's Date: 11/02/2013    History of Present Illness      PT Comments    Pt very motivated but limited this session by N&V- RN aware  Follow Up Recommendations  Home health PT     Equipment Recommendations  Rolling walker with 5" wheels    Recommendations for Other Services OT consult     Precautions / Restrictions Precautions Precautions: Fall Restrictions Weight Bearing Restrictions: No Other Position/Activity Restrictions: WBAT    Mobility  Bed Mobility Overal bed mobility: Needs Assistance Bed Mobility: Sit to Supine     Supine to sit: Min assist;Mod assist Sit to supine: Min assist;Mod assist   General bed mobility comments: cues for sequence and use of R LE to self assist  Transfers Overall transfer level: Needs assistance Equipment used: Rolling walker (2 wheeled) Transfers: Sit to/from Stand Sit to Stand: Min assist;Mod assist         General transfer comment: cues for LE management and use of UEs to self assist  Ambulation/Gait Ambulation/Gait assistance: Min assist Ambulation Distance (Feet): 5 Feet Assistive device: Rolling walker (2 wheeled) Gait Pattern/deviations: Step-to pattern;Decreased step length - right;Decreased step length - left;Shuffle;Trunk flexed Gait velocity: mod   General Gait Details: cues for posture, position from RW and initial sequence   Stairs            Wheelchair Mobility    Modified Rankin (Stroke Patients Only)       Balance                                    Cognition Arousal/Alertness: Awake/alert Behavior During Therapy: WFL for tasks assessed/performed Overall Cognitive Status: Within Functional Limits for tasks assessed                      Exercises Total Joint Exercises Ankle Circles/Pumps: AROM;Both;15 reps;Supine Quad Sets: AROM;Both;10 reps;Supine Heel Slides:  AAROM;Left;15 reps;Supine Hip ABduction/ADduction: AAROM;Left;10 reps;Supine    General Comments        Pertinent Vitals/Pain 6/10; premed, ice packs provided    Home Living Family/patient expects to be discharged to:: Private residence Living Arrangements: Spouse/significant other Available Help at Discharge: Family Type of Home: House Home Access: Stairs to enter Entrance Stairs-Rails: Right;Left Home Layout: One level Home Equipment: Cane - single point      Prior Function Level of Independence: Independent;Independent with assistive device(s)      Comments: Used cane as needed   PT Goals (current goals can now be found in the care plan section) Acute Rehab PT Goals Patient Stated Goal: Resume previous lifestyle with decreased pain PT Goal Formulation: With patient Time For Goal Achievement: 11/09/13 Potential to Achieve Goals: Good Progress towards PT goals: Progressing toward goals    Frequency  7X/week    PT Plan Current plan remains appropriate    Co-evaluation             End of Session Equipment Utilized During Treatment: Gait belt Activity Tolerance: Other (comment) (ltd by nausea) Patient left: in bed;with call bell/phone within reach;with family/visitor present     Time: [id number] PT Time Calculation (min): 17 min  Charges:  $Gait Training: 8-22 mins $Therapeutic Exercise: 8-22 mins $Therapeutic Activity: 8-22 mins  G Codes:      BRADSHAW,HUNTER 11/02/2013, 4:17 PM

## 2013-11-02 NOTE — H&P (Signed)
TOTAL HIP ADMISSION H&P  Patient is admitted for left total hip arthroplasty.  Subjective:  Chief Complaint: left hip pain  HPI: Juan Reed Upperman, 68 y.o. male, has a history of pain and functional disability in the left hip(s) due to arthritis and patient has failed non-surgical conservative treatments for greater than 12 weeks to include NSAID's and/or analgesics, corticosteriod injections, weight reduction as appropriate and activity modification.  Onset of symptoms was gradual starting 5 years ago with gradually worsening course since that time.The patient noted no past surgery on the left hip(s).  Patient currently rates pain in the left hip at 10 out of 10 with activity. Patient has night pain, worsening of pain with activity and weight bearing, trendelenberg gait, pain that interfers with activities of daily living and pain with passive range of motion. Patient has evidence of subchondral cysts, subchondral sclerosis, periarticular osteophytes and joint space narrowing by imaging studies. This condition presents safety issues increasing the risk of fall.  There is no current active infection.  Patient Active Problem List   Diagnosis Date Noted  . Arthritis of left hip 11/02/2013   Past Medical History  Diagnosis Date  . Hypertension   . Arthritis   . Macular degeneration of both eyes 2012    receiving injections  . Hyperlipidemia, mixed   . COPD (chronic obstructive pulmonary disease) 6/15    per chest x ray    Past Surgical History  Procedure Laterality Date  . Colonoscopy      x 3  . Nasal polyp surgery      Prescriptions prior to admission  Medication Sig Dispense Refill  . acetaminophen (TYLENOL) 650 MG CR tablet Take 1,300 mg by mouth every 8 (eight) hours as needed for pain.      Marland Kitchen. acetaminophen (TYLENOL) 650 MG suppository Place 650 mg rectally every 4 (four) hours as needed (Pain).      Marland Kitchen. losartan-hydrochlorothiazide (HYZAAR) 100-25 MG per tablet Take 1 tablet by mouth  every morning.      . meloxicam (MOBIC) 15 MG tablet Take 15 mg by mouth daily.      . Multiple Vitamins-Minerals (CENTRUM SILVER ULTRA MENS PO) Take 1 tablet by mouth.      Marland Kitchen. OVER THE COUNTER MEDICATION Lutein 25mg   With Zeaxanthin  TAB dAILY      . ranibizumab (LUCENTIS) 0.5 MG/0.05ML SOLN 0.5 mg by Intravitreal route every 3 (three) months. Alternates eyes between       No Known Allergies  History  Substance Use Topics  . Smoking status: Former Smoker -- 1.00 packs/day for 10 years    Quit date: 10/23/1979  . Smokeless tobacco: Never Used  . Alcohol Use: Yes     Comment: "750 ml week(some weeks) liquor"    History reviewed. No pertinent family history.   Review of Systems  Musculoskeletal: Positive for joint pain.  All other systems reviewed and are negative.   Objective:  Physical Exam  Constitutional: He is oriented to person, place, and time. He appears well-developed and well-nourished.  HENT:  Head: Normocephalic and atraumatic.  Neck: Normal range of motion. Neck supple.  Cardiovascular: Normal rate and regular rhythm.   Respiratory: Effort normal and breath sounds normal.  GI: Soft. Bowel sounds are normal.  Musculoskeletal:       Left hip: He exhibits decreased range of motion, decreased strength and bony tenderness.  Neurological: He is alert and oriented to person, place, and time.  Skin: Skin is warm and dry.  Psychiatric: He has a normal mood and affect.    Vital signs in last 24 hours: Temp:  [97.9 F (36.6 C)] 97.9 F (36.6 C) (06/26 0519) Pulse Rate:  [93] 93 (06/26 0519) Resp:  [18] 18 (06/26 0519) BP: (173)/(94) 173/94 mmHg (06/26 0519) SpO2:  [96 %] 96 % (06/26 0519)  Labs:   There is no weight on file to calculate BMI.   Imaging Review Plain radiographs demonstrate severe degenerative joint disease of the left hip(s). The bone quality appears to be good for age and reported activity level.  Assessment/Plan:  End stage arthritis, left  hip(s)  The patient history, physical examination, clinical judgement of the provider and imaging studies are consistent with end stage degenerative joint disease of the left hip(s) and total hip arthroplasty is deemed medically necessary. The treatment options including medical management, injection therapy, arthroscopy and arthroplasty were discussed at length. The risks and benefits of total hip arthroplasty were presented and reviewed. The risks due to aseptic loosening, infection, stiffness, dislocation/subluxation,  thromboembolic complications and other imponderables were discussed.  The patient acknowledged the explanation, agreed to proceed with the plan and consent was signed. Patient is being admitted for inpatient treatment for surgery, pain control, PT, OT, prophylactic antibiotics, VTE prophylaxis, progressive ambulation and ADL's and discharge planning.The patient is planning to be discharged home with home health services

## 2013-11-03 LAB — BASIC METABOLIC PANEL
BUN: 11 mg/dL (ref 6–23)
CALCIUM: 8.7 mg/dL (ref 8.4–10.5)
CO2: 32 meq/L (ref 19–32)
CREATININE: 0.82 mg/dL (ref 0.50–1.35)
Chloride: 96 mEq/L (ref 96–112)
GFR calc Af Amer: >90 mL/min (ref >90)
GFR, EST NON AFRICAN AMERICAN: 89 mL/min — AB (ref >90)
Glucose, Bld: 147 mg/dL — ABNORMAL HIGH (ref 70–99)
Potassium: 4.8 mEq/L (ref 3.7–5.3)
SODIUM: 135 meq/L — AB (ref 137–147)

## 2013-11-03 LAB — CBC
HEMATOCRIT: 38.2 % — AB (ref 39.0–52.0)
HEMOGLOBIN: 12.7 g/dL — AB (ref 13.0–17.0)
MCH: 32.1 pg (ref 26.0–34.0)
MCHC: 33.2 g/dL (ref 30.0–36.0)
MCV: 96.5 fL (ref 78.0–100.0)
Platelets: 287 10*3/uL (ref 150–400)
RBC: 3.96 MIL/uL — AB (ref 4.22–5.81)
RDW: 12.6 % (ref 11.5–15.5)
WBC: 8.7 10*3/uL (ref 4.0–10.5)

## 2013-11-03 MED ORDER — OXYCODONE-ACETAMINOPHEN 5-325 MG PO TABS: 1.0000 | ORAL_TABLET | ORAL | Status: DC | PRN

## 2013-11-03 MED ORDER — ASPIRIN 325 MG PO TBEC: 325.0000 mg | DELAYED_RELEASE_TABLET | Freq: Two times a day (BID) | ORAL | Status: DC

## 2013-11-03 NOTE — Progress Notes (Signed)
Subjective: 1 Day Post-Op Procedure(s) (LRB): LEFT TOTAL HIP ARTHROPLASTY ANTERIOR APPROACH (Left) Patient reports pain as moderate.  Did have nausea last evening and this am.  Objective: Vital signs in last 24 hours: Temp:  [97.4 F (36.3 C)-99 F (37.2 C)] 99 F (37.2 C) (06/27 0525) Pulse Rate:  [69-94] 94 (06/27 0525) Resp:  [10-18] 16 (06/27 0733) BP: (117-145)/(72-87) 127/78 mmHg (06/27 0525) SpO2:  [94 %-100 %] 94 % (06/27 0525) Weight:  [116.121 kg (256 lb)] 116.121 kg (256 lb) (06/26 1038)  Intake/Output from previous day: 06/26 0701 - 06/27 0700 In: 4746.3 [P.O.:480; I.V.:4161.3; IV Piggyback:105] Out: 2712 [Urine:2110; Emesis/NG output:2; Blood:600] Intake/Output this shift: Total I/O In: 469.8 [P.O.:240; I.V.:229.8] Out: 400 [Urine:400]   Recent Labs  11/03/13 0450  HGB 12.7*    Recent Labs  11/03/13 0450  WBC 8.7  RBC 3.96*  HCT 38.2*  PLT 287    Recent Labs  11/03/13 0450  NA 135*  K 4.8  CL 96  CO2 32  BUN 11  CREATININE 0.82  GLUCOSE 147*  CALCIUM 8.7   No results found for this basename: LABPT, INR,  in the last 72 hours  Sensation intact distally Intact pulses distally Dorsiflexion/Plantar flexion intact Incision: scant drainage  Assessment/Plan: 1 Day Post-Op Procedure(s) (LRB): LEFT TOTAL HIP ARTHROPLASTY ANTERIOR APPROACH (Left) Up with therapy Plan for discharge tomorrow  Kathryne HitchBLACKMAN,CHRISTOPHER Y 11/03/2013, 9:53 AM

## 2013-11-03 NOTE — Discharge Instructions (Signed)
Pickup stool softener for constipation. °Weight Bearing as tolerated  °No hip precautions °Progress activities slowly °Expect hip and possible knee soreness and swelling. Apply heat or ice as needed. °Keep dressing clean dry and intact, may shower with dressing intact. On Friday remove dressing, incision can get wet. After showering apply clean dressing. °

## 2013-11-03 NOTE — Progress Notes (Signed)
Physical Therapy Treatment Patient Details Name: Juan Reed MRN: [id number] DOB: 10-Apr-1946 Today's Date: 11/03/2013    History of Present Illness L THA, anterior approach    PT Comments    Pt progressing.  Car transfers reviewed with pt and spouse  Follow Up Recommendations  Home health PT     Equipment Recommendations  Rolling walker with 5" wheels    Recommendations for Other Services OT consult     Precautions / Restrictions Precautions Precautions: Fall Restrictions Weight Bearing Restrictions: No Other Position/Activity Restrictions: WBAT    Mobility  Bed Mobility Overal bed mobility: Needs Assistance Bed Mobility: Sit to Supine       Sit to supine: Min guard   General bed mobility comments: able to use R LE to assist L LE  Transfers Overall transfer level: Needs assistance Equipment used: Rolling walker (2 wheeled) Transfers: Sit to/from Stand Sit to Stand: Min guard         General transfer comment: cues for LE management and use of UEs to self assist  Ambulation/Gait Ambulation/Gait assistance: Min assist;Min guard Ambulation Distance (Feet): 169 Feet Assistive device: Rolling walker (2 wheeled) Gait Pattern/deviations: Step-to pattern;Decreased step length - right;Decreased step length - left;Shuffle;Trunk flexed Gait velocity: mod   General Gait Details: cues for posture, position from RW and initial sequence   Stairs            Wheelchair Mobility    Modified Rankin (Stroke Patients Only)       Balance                                    Cognition Arousal/Alertness: Awake/alert Behavior During Therapy: WFL for tasks assessed/performed Overall Cognitive Status: Within Functional Limits for tasks assessed                      Exercises      General Comments        Pertinent Vitals/Pain 7/10; premed    Home Living                      Prior Function            PT Goals (current  goals can now be found in the care plan section) Acute Rehab PT Goals Patient Stated Goal: Resume previous lifestyle with decreased pain PT Goal Formulation: With patient Time For Goal Achievement: 11/09/13 Potential to Achieve Goals: Good Progress towards PT goals: Progressing toward goals    Frequency  7X/week    PT Plan Current plan remains appropriate    Co-evaluation             End of Session   Activity Tolerance: Patient tolerated treatment well Patient left: in bed;with call bell/phone within reach     Time: 8756-43321501-1524 PT Time Calculation (min): 23 min  Charges:  $Gait Training: 8-22 mins $Therapeutic Activity: 8-22 mins                    G Codes:      BRADSHAW,HUNTER 11/03/2013, 4:04 PM

## 2013-11-03 NOTE — Evaluation (Signed)
Occupational Therapy Evaluation Patient Details Name: Juan Reed MRN: [id number] DOB: [birth year] Today's Date: 11/03/2013    History of Present Illness L THA, anterior approach   Clinical Impression   Pt demonstrates decline in function and safety with ADLs and ADL mobility. Pt would benefit from acute OT services to address impairments to increase level of function and safety to return home    Follow Up Recommendations  Home health OT    Equipment Recommendations  3 in 1 bedside comode;Tub/shower seat    Recommendations for Other Services       Precautions / Restrictions Precautions Precautions: Fall Restrictions Weight Bearing Restrictions: No Other Position/Activity Restrictions: WBAT      Mobility Bed Mobility Overal bed mobility: Needs Assistance Bed Mobility: Supine to Sit     Supine to sit: Min assist Sit to supine: Min assist   General bed mobility comments: able to use R LE to assist L LE  Transfers Overall transfer level: Needs assistance Equipment used: Rolling walker (2 wheeled) Transfers: Sit to/from Stand Sit to Stand: Min assist;Min guard         General transfer comment: cues for LE management and use of UEs to self assist    Balance Overall balance assessment: Needs assistance Sitting-balance support: No upper extremity supported;Feet supported Sitting balance-Leahy Scale: Good     Standing balance support: Single extremity supported;Bilateral upper extremity supported;During functional activity Standing balance-Leahy Scale: Fair                              ADL Overall ADL's : Needs assistance/impaired     Grooming: Wash/dry hands;Wash/dry face;Oral care;Standing;Min guard;Set up   Upper Body Bathing: Set up;Sitting   Lower Body Bathing: Moderate assistance;Maximal assistance   Upper Body Dressing : Set up;Sitting   Lower Body Dressing: Moderate assistance;Maximal assistance   Toilet Transfer: Minimal  assistance;Min guard;Comfort height toilet;RW;Cueing for safety   Toileting- Clothing Manipulation and Hygiene: Moderate assistance;Cueing for safety   Tub/ Shower Transfer: 3 in 1;Minimal assistance;Grab bars;Rolling walker   Functional mobility during ADLs: Min guard;Minimal assistance;Cueing for safety General ADL Comments: Pt provided with education and demo of ADL A/E for home use     Vision  wears glasses at all times                   Perception Perception Perception Tested?: No   Praxis Praxis Praxis tested?: Not tested    Pertinent Vitals/Pain 6/10 L LE pain reported, VSS     Hand Dominance Right   Extremity/Trunk Assessment Upper Extremity Assessment Upper Extremity Assessment: Overall WFL for tasks assessed   Lower Extremity Assessment Lower Extremity Assessment: Defer to PT evaluation   Cervical / Trunk Assessment Cervical / Trunk Assessment: Normal   Communication Communication Communication: No difficulties   Cognition Arousal/Alertness: Awake/alert Behavior During Therapy: WFL for tasks assessed/performed Overall Cognitive Status: Within Functional Limits for tasks assessed                     General Comments   Pt very pleasant and cooperative                Home Living Family/patient expects to be discharged to:: Private residence Living Arrangements: Spouse/significant other Available Help at Discharge: Family Type of Home: House Home Access: Stairs to enter Secretary/administratorntrance Stairs-Number of Steps: 7 Entrance Stairs-Rails: Right;Left Home Layout: One level     Bathroom Shower/Tub: Tub/shower  unit Shower/tub characteristics: Curtain FirefighterBathroom Toilet: Standard     Home Equipment: Cane - single point          Prior Functioning/Environment Level of Independence: Independent;Independent with assistive device(s)        Comments: Used cane as needed    OT Diagnosis: Acute pain   OT Problem List: Pain;Impaired balance  (sitting and/or standing);Decreased activity tolerance;Decreased knowledge of use of DME or AE   OT Treatment/Interventions: Self-care/ADL training;Therapeutic exercise;Patient/family education;Neuromuscular education;Balance training;Therapeutic activities;DME and/or AE instruction    OT Goals(Current goals can be found in the care plan section) Acute Rehab OT Goals Patient Stated Goal: Resume previous lifestyle with decreased pain OT Goal Formulation: With patient Time For Goal Achievement: 11/10/13 Potential to Achieve Goals: Good ADL Goals Pt Will Perform Grooming: with set-up;with supervision;standing Pt Will Perform Lower Body Bathing: with mod assist;with min assist;sitting/lateral leans;sit to/from stand;with caregiver independent in assisting;with adaptive equipment Pt Will Perform Lower Body Dressing: with mod assist;with min assist;sitting/lateral leans;sit to/from stand;with caregiver independent in assisting;with adaptive equipment Pt Will Transfer to Toilet: with min guard assist;with supervision;ambulating (3 in 1 over toilet) Pt Will Perform Toileting - Clothing Manipulation and hygiene: with min assist;sit to/from stand Pt Will Perform Tub/Shower Transfer: tub bench;with min assist;with min guard assist;3 in 1 Additional ADL Goal #1: Pt will complete bed mobility min guard A - sup to sit EOB in prep for ADLs  OT Frequency: Min 2X/week   Barriers to D/C:    none                     End of Session Equipment Utilized During Treatment: Rolling walker;Other (comment) (3 in 1 over toilet)  Activity Tolerance: Patient tolerated treatment well Patient left: in chair;with call bell/phone within reach   Time: 0902-0927 OT Time Calculation (min): 25 min Charges:  OT General Charges $OT Visit: 1 Procedure OT Evaluation $Initial OT Evaluation Tier I: 1 Procedure OT Treatments $Therapeutic Activity: 8-22 mins G-Codes:    Galen ManilaSpencer, Chelbie Jarnagin Jeanette 11/03/2013, 11:12  AM

## 2013-11-03 NOTE — Care Management Note (Addendum)
  Page 2 of 2   11/03/2013     10:50:19 AM CARE MANAGEMENT NOTE 11/03/2013  Patient:  Juan Reed, Juan Reed   Account Number:  [id number]  Date Initiated:  11/03/2013  Documentation initiated by:  [facility]  Subjective/Objective Assessment:   adm: left hip pain; LEFT TOTAL HIP ARTHROPLASTY ANTERIOR APPROACH (Left)     Action/Plan:   discharge panning   Anticipated DC Date:  11/04/2013   Anticipated DC Plan:  [place]  CM consult  PCP issues      [facility] Choice  HOME HEALTH   Choice offered to / List presented to:  C-1 Patient   DME arranged  [facility]ass[facility]      DME agency  [place] arranged  [place]   Status of service:  Completed, signed off Medicare Important Message given?   (If response is "NO", the following Medicare IM given date fields will be blank) Date Medicare IM given:   Date Additional Medicare IM given:    Discharge Disposition:  [place]  Per UR Regulation:    If discussed at Long Length of Stay Meetings, dates discussed:    Comments:  11/03/13 10:30 CM met with pt to confirm choice of home health agency.  Pt presurgically set up with [facility] (according to [place] rep, [place]).  Address and contact information verified with pt.  PCP Resource list given pt.  DME rep will deliver 3n1 and rolling walker to room prior to discharge.  No other CM needs were communicated.  Mariane Masters, BSN, CM [phone].

## 2013-11-03 NOTE — Progress Notes (Signed)
Physical Therapy Treatment Patient Details Name: Juan MiniumFred D Reed MRN: [id number] DOB: 09-May-1946 Today's Date: 11/03/2013    History of Present Illness      PT Comments    Progressing but pain ltd this am  Follow Up Recommendations  Home health PT     Equipment Recommendations  Rolling walker with 5" wheels    Recommendations for Other Services OT consult     Precautions / Restrictions Precautions Precautions: Fall Restrictions Weight Bearing Restrictions: No Other Position/Activity Restrictions: WBAT    Mobility  Bed Mobility Overal bed mobility: Needs Assistance Bed Mobility: Sit to Supine;Supine to Sit     Supine to sit: Min assist Sit to supine: Min assist   General bed mobility comments: cues for sequence and use of R LE to self assist  Transfers Overall transfer level: Needs assistance Equipment used: Rolling walker (2 wheeled) Transfers: Sit to/from Stand Sit to Stand: Min assist;Min guard         General transfer comment: cues for LE management and use of UEs to self assist  Ambulation/Gait Ambulation/Gait assistance: Min assist;Min guard Ambulation Distance (Feet): 68 Feet Assistive device: Rolling walker (2 wheeled) Gait Pattern/deviations: Step-to pattern;Decreased step length - right;Decreased step length - left;Shuffle;Trunk flexed     General Gait Details: cues for posture, position from RW and initial sequence   Stairs            Wheelchair Mobility    Modified Rankin (Stroke Patients Only)       Balance                                    Cognition Arousal/Alertness: Awake/alert Behavior During Therapy: WFL for tasks assessed/performed Overall Cognitive Status: Within Functional Limits for tasks assessed                      Exercises Total Joint Exercises Ankle Circles/Pumps: AROM;Both;15 reps;Supine Quad Sets: AROM;Both;10 reps;Supine Heel Slides: AAROM;Left;15 reps;Supine Hip  ABduction/ADduction: AAROM;Left;Supine;15 reps    General Comments        Pertinent Vitals/Pain 7/10 with activity; premed, ice packs provided.    Home Living Family/patient expects to be discharged to:: Private residence Living Arrangements: Spouse/significant other Available Help at Discharge: Family Type of Home: House Home Access: Stairs to enter Entrance Stairs-Rails: Right;Left Home Layout: One level Home Equipment: Cane - single point      Prior Function Level of Independence: Independent;Independent with assistive device(s)      Comments: Used cane as needed   PT Goals (current goals can now be found in the care plan section) Acute Rehab PT Goals Patient Stated Goal: Resume previous lifestyle with decreased pain PT Goal Formulation: With patient Time For Goal Achievement: 11/09/13 Potential to Achieve Goals: Good Progress towards PT goals: Progressing toward goals    Frequency  7X/week    PT Plan Current plan remains appropriate    Co-evaluation             End of Session Equipment Utilized During Treatment: Gait belt Activity Tolerance: Patient tolerated treatment well Patient left: in bed;with call bell/phone within reach     Time: 4098-11910809-0837 PT Time Calculation (min): 28 min  Charges:  $Gait Training: 8-22 mins $Therapeutic Exercise: 8-22 mins                    G Codes:      BRADSHAW,HUNTER 11/03/2013,  9:55 AM

## 2013-11-04 LAB — CBC
HCT: 37.1 % — ABNORMAL LOW (ref 39.0–52.0)
Hemoglobin: 12.1 g/dL — ABNORMAL LOW (ref 13.0–17.0)
MCH: 30.9 pg (ref 26.0–34.0)
MCHC: 32.6 g/dL (ref 30.0–36.0)
MCV: 94.6 fL (ref 78.0–100.0)
PLATELETS: 286 10*3/uL (ref 150–400)
RBC: 3.92 MIL/uL — AB (ref 4.22–5.81)
RDW: 12.5 % (ref 11.5–15.5)
WBC: 8.8 10*3/uL (ref 4.0–10.5)

## 2013-11-04 NOTE — Plan of Care (Signed)
Problem: Discharge Progression Outcomes Goal: Anticoagulant follow-up in place Outcome: Not Applicable Date Met:  39/43/20 asa

## 2013-11-04 NOTE — Progress Notes (Signed)
Physical Therapy Treatment Patient Details Name: Juan MiniumFred Reed Date MRN: [id number] DOB: 09/05/1945 Today's Date: 11/04/2013    History of Present Illness L THA, anterior approach    PT Comments    Pt continues to progress well.  Reviewed car transfers, stairs with pt.  Follow Up Recommendations  Home health PT     Equipment Recommendations  Rolling walker with 5" wheels    Recommendations for Other Services OT consult     Precautions / Restrictions Precautions Precautions: Fall Restrictions Weight Bearing Restrictions: No Other Position/Activity Restrictions: WBAT    Mobility  Bed Mobility Overal bed mobility: Needs Assistance Bed Mobility: Supine to Sit;Sit to Supine     Supine to sit: Supervision Sit to supine: Supervision   General bed mobility comments: Pt assisted L LE with sheet out of bed and with other LE into bed  Transfers Overall transfer level: Needs assistance Equipment used: Rolling walker (2 wheeled) Transfers: Sit to/from Stand Sit to Stand: Supervision         General transfer comment: min cues for use of UEs  Ambulation/Gait Ambulation/Gait assistance: Min guard;Supervision Ambulation Distance (Feet): 222 Feet Assistive device: Rolling walker (2 wheeled) Gait Pattern/deviations: Step-to pattern;Step-through pattern;Shuffle;Trunk flexed Gait velocity: mod   General Gait Details: cues for posture, position from RW and initial sequence   Stairs Stairs: Yes Stairs assistance: Min assist Stair Management: One rail Left;Step to pattern;Forwards;With crutches Number of Stairs: 4 General stair comments: cues for sequence and foot/crutch placement  Wheelchair Mobility    Modified Rankin (Stroke Patients Only)       Balance                                    Cognition Arousal/Alertness: Awake/alert Behavior During Therapy: WFL for tasks assessed/performed Overall Cognitive Status: Within Functional Limits for tasks  assessed                      Exercises Total Joint Exercises Ankle Circles/Pumps: AROM;Both;15 reps;Supine Quad Sets: AROM;Both;10 reps;Supine Gluteal Sets: AROM;Both;10 reps;Supine Heel Slides: AAROM;Left;Supine;20 reps Hip ABduction/ADduction: AAROM;Left;Supine;20 reps    General Comments        Pertinent Vitals/Pain 4/10; premed    Home Living                      Prior Function            PT Goals (current goals can now be found in the care plan section) Acute Rehab PT Goals Patient Stated Goal: Resume previous lifestyle with decreased pain PT Goal Formulation: With patient Time For Goal Achievement: 11/09/13 Potential to Achieve Goals: Good Progress towards PT goals: Progressing toward goals    Frequency  7X/week    PT Plan Current plan remains appropriate    Co-evaluation             End of Session   Activity Tolerance: Patient tolerated treatment well Patient left: in bed;with call bell/phone within reach     Time: 0755-0840 PT Time Calculation (min): 45 min  Charges:  $Gait Training: 8-22 mins $Therapeutic Exercise: 8-22 mins $Therapeutic Activity: 8-22 mins                    G Codes:      Tudor Chandley 11/04/2013, 8:44 AM

## 2013-11-04 NOTE — Discharge Summary (Signed)
Patient ID: Juan Reed Highley MRN: [id number] DOB/AGE: 11-Jan-1946 68 y.o.  Admit date: 11/02/2013 Discharge date: 11/04/2013  Admission Diagnoses:  Principal Problem:   Arthritis of left hip Active Problems:   Status post THR (total hip replacement)   Discharge Diagnoses:  Same  Past Medical History  Diagnosis Date  . Hypertension   . Arthritis   . Macular degeneration of both eyes 2012    receiving injections  . Hyperlipidemia, mixed   . COPD (chronic obstructive pulmonary disease) 6/15    per chest x ray    Surgeries: Procedure(s): LEFT TOTAL HIP ARTHROPLASTY ANTERIOR APPROACH on 11/02/2013   Consultants:    Discharged Condition: Improved  Hospital Course: Juan Reed Linnen is an 68 y.o. male who was admitted 11/02/2013 for operative treatment ofArthritis of left hip. Patient has severe unremitting pain that affects sleep, daily activities, and work/hobbies. After pre-op clearance the patient was taken to the operating room on 11/02/2013 and underwent  Procedure(s): LEFT TOTAL HIP ARTHROPLASTY ANTERIOR APPROACH.    Patient was given perioperative antibiotics: Anti-infectives   Start     Dose/Rate Route Frequency Ordered Stop   11/02/13 1400  ceFAZolin (ANCEF) IVPB 1 g/50 mL premix     1 g 100 mL/hr over 30 Minutes Intravenous Every 6 hours 11/02/13 1043 11/02/13 2012   11/02/13 0518  ceFAZolin (ANCEF) IVPB 2 g/50 mL premix     2 g 100 mL/hr over 30 Minutes Intravenous On call to O.R. 11/02/13 0518 11/02/13 0726       Patient was given sequential compression devices, early ambulation, and chemoprophylaxis to prevent DVT.  Patient benefited maximally from hospital stay and there were no complications.    Recent vital signs: Patient Vitals for the past 24 hrs:  BP Temp Temp src Pulse Resp SpO2  11/04/13 0535 146/74 mmHg 98.5 F (36.9 C) Oral 92 18 92 %  11/03/13 2215 127/72 mmHg 99.5 F (37.5 C) Oral 90 18 92 %  11/03/13 1420 129/77 mmHg 98.5 F (36.9 C) Oral 94 18 94 %   11/03/13 1115 - - - - 18 -  11/03/13 1010 126/74 mmHg 98.8 F (37.1 C) Oral 88 16 94 %     Recent laboratory studies:  Recent Labs  11/03/13 0450 11/04/13 0442  WBC 8.7 8.8  HGB 12.7* 12.1*  HCT 38.2* 37.1*  PLT 287 286  NA 135*  --   K 4.8  --   CL 96  --   CO2 32  --   BUN 11  --   CREATININE 0.82  --   GLUCOSE 147*  --   CALCIUM 8.7  --      Discharge Medications:     Medication List    STOP taking these medications       acetaminophen 650 MG CR tablet  Commonly known as:  TYLENOL     acetaminophen 650 MG suppository  Commonly known as:  TYLENOL     meloxicam 15 MG tablet  Commonly known as:  MOBIC      TAKE these medications       aspirin 325 MG EC tablet  Take 1 tablet (325 mg total) by mouth 2 (two) times daily after a meal.     CENTRUM SILVER ULTRA MENS PO  Take 1 tablet by mouth.     losartan-hydrochlorothiazide 100-25 MG per tablet  Commonly known as:  HYZAAR  Take 1 tablet by mouth every morning.     LUCENTIS 0.5 MG/0.05ML Soln  Generic drug:  ranibizumab  0.5 mg by Intravitreal route every 3 (three) months. Alternates eyes between     OVER THE COUNTER MEDICATION  Lutein 25mg   With Zeaxanthin  TAB dAILY     oxyCODONE-acetaminophen 5-325 MG per tablet  Commonly known as:  ROXICET  Take 1-2 tablets by mouth every 4 (four) hours as needed for severe pain.        Diagnostic Studies: Dg Chest 2 View  10/22/2013   CLINICAL DATA:  Preoperative evaluation for hip surgery  EXAM: CHEST  2 VIEW  COMPARISON:  None.  FINDINGS: Cardiac shadow is within normal limits. The lungs are well aerated bilaterally. Calcified granuloma is noted in the left lung base. Mild hyperinflation is noted consistent with COPD. Degenerative changes of the thoracic spine are seen.  IMPRESSION: COPD without acute abnormality.   Electronically Signed   By: Alcide CleverMark  Lukens M.Reed.   On: 10/22/2013 16:07   Dg Hip Complete Left  11/02/2013   CLINICAL DATA:  Left total hip  replacement  EXAM: LEFT HIP - COMPLETE 2+ VIEW  COMPARISON:  None.  FINDINGS: Two C-arm images show total hip replacement on the left. Components appear well positioned without evidence of complicating feature. Other regional structures appear intact.  IMPRESSION: Good appearance following total hip replacement on the left.   Electronically Signed   By: Paulina FusiMark  Shogry M.Reed.   On: 11/02/2013 09:11   Dg Pelvis Portable  11/02/2013   CLINICAL DATA:  Left hip arthroplasty  EXAM: PORTABLE PELVIS 1-2 VIEWS  COMPARISON:  None.  FINDINGS: Total left hip arthroplasty without failure or complication. No fracture or dislocation. Postsurgical changes in the surrounding soft tissues.  Mild right hip osteoarthritis.  IMPRESSION: Total left hip arthroplasty without failure or complication. No dislocation.   Electronically Signed   By: Elige KoHetal  Patel   On: 11/02/2013 10:00   Dg Hip Portable 1 View Left  11/02/2013   CLINICAL DATA:  Left hip replacement  EXAM: PORTABLE LEFT HIP - 1 VIEW  COMPARISON:  None.  FINDINGS: Cross-table lateral view of the left hip demonstrates a total left hip arthroplasty without failure or complication. No fracture or dislocation. Postsurgical changes in the surrounding soft tissues.  IMPRESSION: Total left hip arthroplasty without failure or complication.   Electronically Signed   By: Elige KoHetal  Patel   On: 11/02/2013 09:59   Dg C-arm 1-60 Min-no Report  11/02/2013   CLINICAL DATA: left anterior hip   C-ARM 1-60 MINUTES  Fluoroscopy was utilized by the requesting physician.  No radiographic  interpretation.     Disposition: Stable / improved      Discharge Instructions   Weight bearing as tolerated    Complete by:  As directed            Follow-up Information   [facility] ([facility])    Contact information:   [address] [place] [address] [phone]       Please follow up. (home health physical therapy)       Follow  up with Kathryne HitchBLACKMAN,CHRISTOPHER Y, MD. Schedule an appointment as soon as possible for a visit in 2 weeks.   Specialty:  Orthopedic Surgery   Contact information:   [address] [place] [address] [phone]        Signed: Richardean CanalCLARK, GILBERT 11/04/2013, 8:25 AM

## 2013-11-04 NOTE — Progress Notes (Signed)
Subjective: 2 Days Post-Op Procedure(s) (LRB): LEFT TOTAL HIP ARTHROPLASTY ANTERIOR APPROACH (Left) Patient reports pain as mild.   Patient wanting to go home today after PT. Objective: Vital signs in last 24 hours: Temp:  [98.5 F (36.9 C)-99.5 F (37.5 C)] 98.5 F (36.9 C) (06/28 0535) Pulse Rate:  [88-94] 92 (06/28 0535) Resp:  [16-18] 18 (06/28 0535) BP: (126-146)/(72-77) 146/74 mmHg (06/28 0535) SpO2:  [92 %-94 %] 92 % (06/28 0535)  Intake/Output from previous day: 06/27 0701 - 06/28 0700 In: 1189.8 [P.O.:960; I.V.:229.8] Out: 2950 [Urine:2950] Intake/Output this shift:     Recent Labs  11/03/13 0450 11/04/13 0442  HGB 12.7* 12.1*    Recent Labs  11/03/13 0450 11/04/13 0442  WBC 8.7 8.8  RBC 3.96* 3.92*  HCT 38.2* 37.1*  PLT 287 286    Recent Labs  11/03/13 0450  NA 135*  K 4.8  CL 96  CO2 32  BUN 11  CREATININE 0.82  GLUCOSE 147*  CALCIUM 8.7   No results found for this basename: LABPT, INR,  in the last 72 hours  Neurovascular intact Sensation intact distally Intact pulses distally Dorsiflexion/Plantar flexion intact Incision: dressing C/D/I Compartment soft  Assessment/Plan: 2 Days Post-Op Procedure(s) (LRB): LEFT TOTAL HIP ARTHROPLASTY ANTERIOR APPROACH (Left) Up with therapy Discharge home with home health  Richardean CanalCLARK, GILBERT 11/04/2013, 8:22 AM

## 2013-11-04 NOTE — Progress Notes (Signed)
Discharged from floor via w/c, wife with pt. No changes in assessment. Council, [place]  

## 2013-11-06 NOTE — Addendum Note (Signed)
Addendum created 11/06/13 1214 by Gaylan GeroldJohn R Germeroth, MD   Modules edited: Anesthesia Attestations

## 2014-11-14 IMAGING — RF DG HIP (WITH OR WITHOUT PELVIS) 2-3V*L*
1 series · 2 of 2 positions shown · non-contrast
Comparison: None.

CLINICAL DATA: Left total hip replacement

EXAM:
LEFT HIP - COMPLETE 2+ VIEW

[Series 1: run · 2 of 2 slices shown]
[im 1/2]
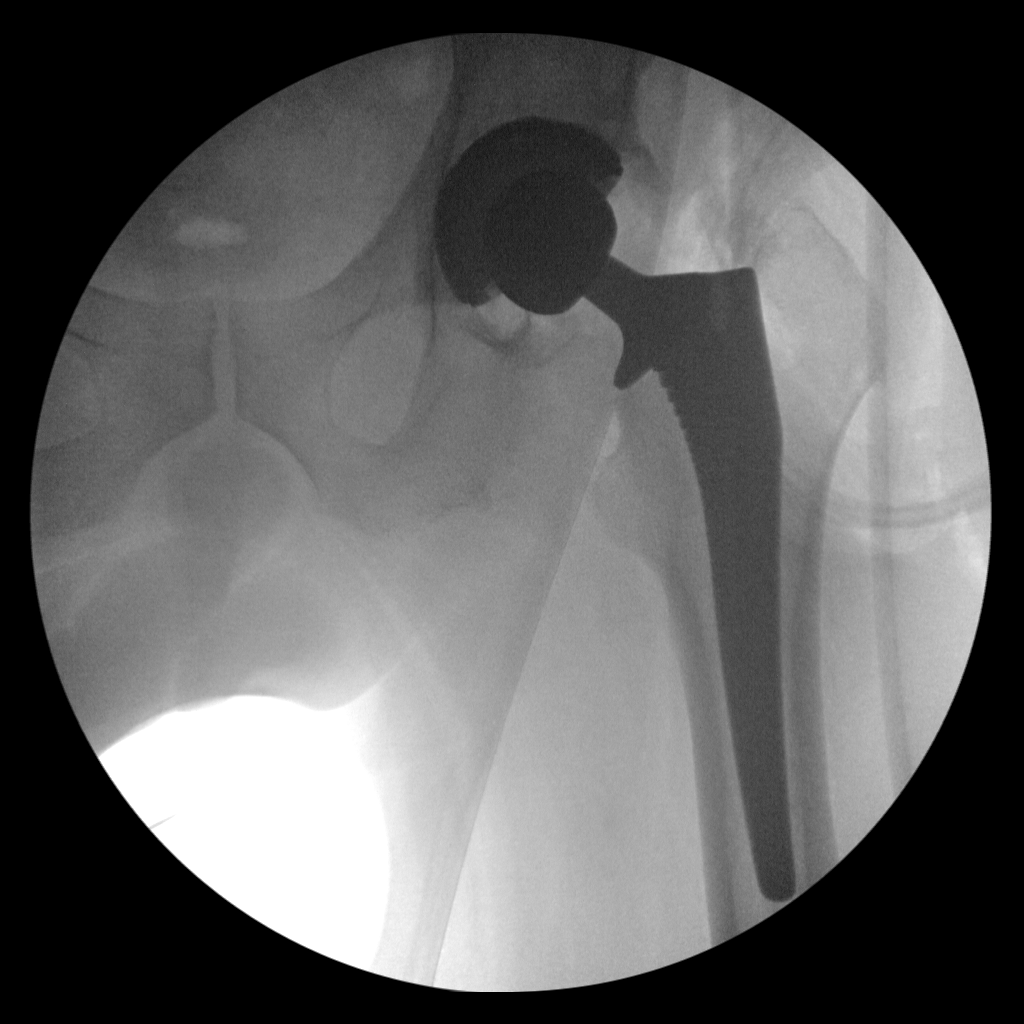
[im 2/2]
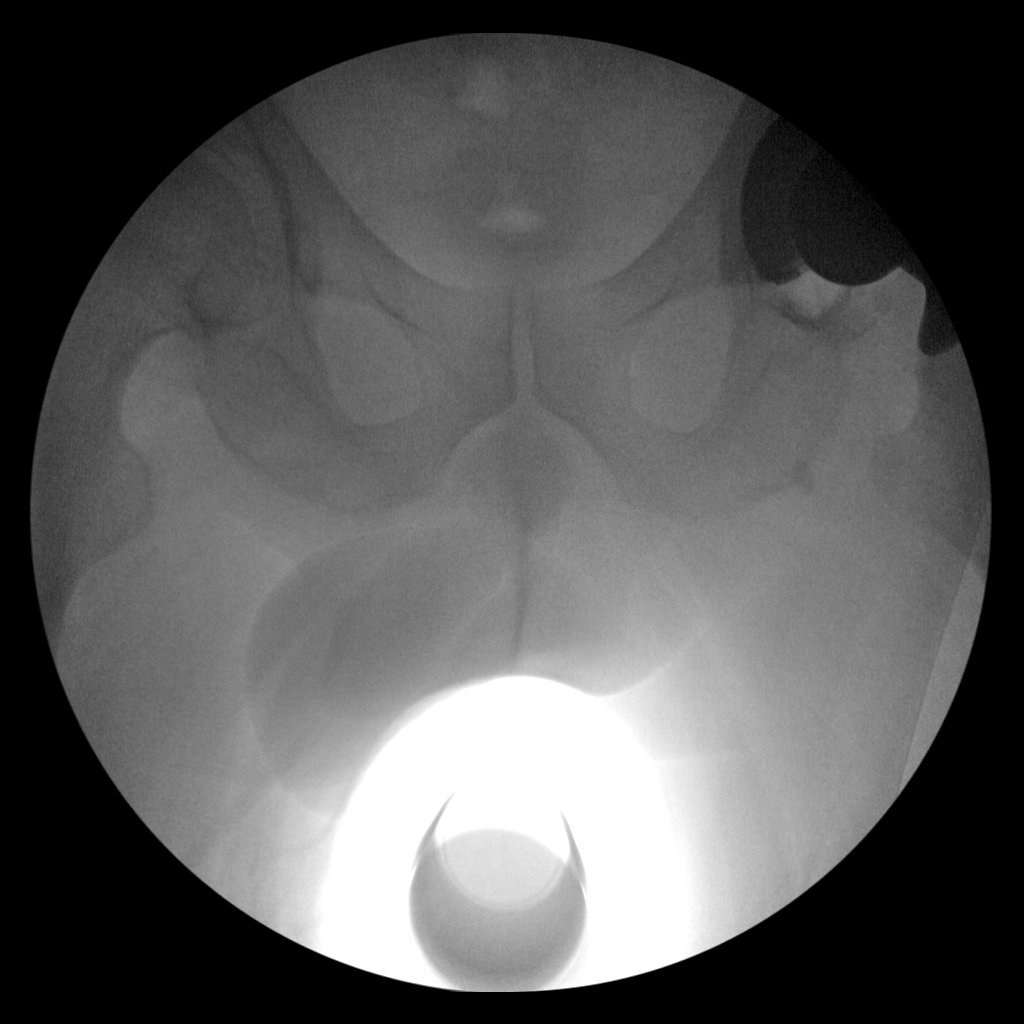

[2 of 2 positions shown; findings below may reference images not displayed]

FINDINGS: Two C-arm images show total hip replacement on the left. Components
appear well positioned without evidence of complicating feature.
Other regional structures appear intact.
IMPRESSION: Good appearance following total hip replacement on the left.

## 2017-10-15 ENCOUNTER — Other Ambulatory Visit: Payer: Self-pay

## 2017-10-15 ENCOUNTER — Encounter: Payer: Self-pay | Admitting: Emergency Medicine

## 2017-10-15 ENCOUNTER — Emergency Department
Admission: EM | Admit: 2017-10-15 | Discharge: 2017-10-15 | Disposition: A | Discharge status: Home / Self Care | Payer: Medicare Other | Attending: Emergency Medicine | Admitting: Emergency Medicine

## 2017-10-15 DIAGNOSIS — I10 Essential (primary) hypertension: Secondary | ICD-10-CM | POA: Insufficient documentation

## 2017-10-15 DIAGNOSIS — M10071 Idiopathic gout, right ankle and foot: Secondary | ICD-10-CM | POA: Diagnosis not present

## 2017-10-15 DIAGNOSIS — Z79899 Other long term (current) drug therapy: Secondary | ICD-10-CM | POA: Diagnosis not present

## 2017-10-15 DIAGNOSIS — J449 Chronic obstructive pulmonary disease, unspecified: Secondary | ICD-10-CM | POA: Diagnosis not present

## 2017-10-15 DIAGNOSIS — Z87891 Personal history of nicotine dependence: Secondary | ICD-10-CM | POA: Insufficient documentation

## 2017-10-15 DIAGNOSIS — Z96642 Presence of left artificial hip joint: Secondary | ICD-10-CM | POA: Insufficient documentation

## 2017-10-15 DIAGNOSIS — R2243 Localized swelling, mass and lump, lower limb, bilateral: Secondary | ICD-10-CM | POA: Diagnosis present

## 2017-10-15 DIAGNOSIS — Z7982 Long term (current) use of aspirin: Secondary | ICD-10-CM | POA: Insufficient documentation

## 2017-10-15 DIAGNOSIS — M109 Gout, unspecified: Secondary | ICD-10-CM

## 2017-10-15 LAB — URIC ACID: Uric Acid, Serum: 7.6 mg/dL (ref 4.4–7.6)

## 2017-10-15 MED ORDER — PREDNISONE 50 MG PO TABS: ORAL_TABLET | ORAL | 0 refills | Status: DC

## 2017-10-15 NOTE — ED Notes (Signed)
Pt reports pain to bilateral great toes. Pt states the pain has been there for a little over a week.  Pt is ambulatory without difficulty.

## 2017-10-15 NOTE — ED Notes (Signed)
Patient pushed to his truck up in the parking lot via wheelchair.

## 2017-10-15 NOTE — ED Provider Notes (Signed)
[facility] Emergency Department Provider Note  ____________________________________________  Time seen: Approximately 4:58 PM  I have reviewed the triage vital signs and the nursing notes.   HISTORY  Chief Complaint Toe Pain    HPI Juan Reed is a 72 y.o. male presents to the emergency department with erythema and edema of the bilateral great toes for approximately 1 week.  Patient reports that he has never been diagnosed with gout in the past.  He has a diet that is high and pork and does partake in daily alcohol use.  He denies fever or chills or prior history of septic joints.  He currently rates his pain at 10 out of 10 in intensity.   Past Medical History:  Diagnosis Date  . Arthritis   . COPD (chronic obstructive pulmonary disease) (HCC) 6/15   per chest x ray  . Hyperlipidemia, mixed   . Hypertension   . Macular degeneration of both eyes 2012   receiving injections    Patient Active Problem List   Diagnosis Date Noted  . Arthritis of left hip 11/02/2013  . Status post THR (total hip replacement) 11/02/2013    Past Surgical History:  Procedure Laterality Date  . COLONOSCOPY     x 3  . NASAL POLYP SURGERY    . TOTAL HIP ARTHROPLASTY Left 11/02/2013   Procedure: LEFT TOTAL HIP ARTHROPLASTY ANTERIOR APPROACH;  Surgeon: Kathryne Hitchhristopher Y Blackman, MD;  Location: [facility] ORS;  Service: Orthopedics;  Laterality: Left;    Prior to Admission medications   Medication Sig Start Date End Date Taking? Authorizing Provider  aspirin EC 325 MG EC tablet Take 1 tablet (325 mg total) by mouth 2 (two) times daily after a meal. 11/03/13   Chestine Sporelark, Allayne GitelmanGilbert W, PA-C  losartan-hydrochlorothiazide (HYZAAR) 100-25 MG per tablet Take 1 tablet by mouth every morning.    [provider]  Multiple Vitamins-Minerals (CENTRUM SILVER ULTRA MENS PO) Take 1 tablet by mouth.    [provider]  OVER THE COUNTER MEDICATION Lutein 25mg   With Zeaxanthin  TAB dAILY     [provider]  oxyCODONE-acetaminophen (ROXICET) 5-325 MG per tablet Take 1-2 tablets by mouth every 4 (four) hours as needed for severe pain. 11/03/13   Kirtland Bouchardlark, Gilbert W, PA-C  predniSONE (DELTASONE) 50 MG tablet Take one 50 mg tablet once daily for the next five days. 10/15/17   Orvil FeilWoods, Bauer Ausborn M, PA-C  ranibizumab (LUCENTIS) 0.5 MG/0.05ML SOLN 0.5 mg by Intravitreal route every 3 (three) months. Alternates eyes between    [provider]    Allergies Patient has no known allergies.  No family history on file.  Social History Social History   Tobacco Use  . Smoking status: Former Smoker    Packs/day: 1.00    Years: 10.00    Pack years: 10.00    Last attempt to quit: 10/23/1979    Years since quitting: 38.0  . Smokeless tobacco: Never Used  Substance Use Topics  . Alcohol use: Yes    Comment: "750 ml week(some weeks) liquor"  . Drug use: No     Review of Systems  Constitutional: No fever/chills Eyes: No visual changes. No discharge ENT: No upper respiratory complaints. Cardiovascular: no chest pain. Respiratory: no cough. No SOB. Gastrointestinal: No abdominal pain.  No nausea, no vomiting.  No diarrhea.  No constipation. Genitourinary: Negative for dysuria. No hematuria Musculoskeletal: Negative for musculoskeletal pain. Skin: Patient has erythema and edema of the bilateral great toes. Neurological: Negative for  headaches, focal weakness or numbness.  ____________________________________________   PHYSICAL EXAM:  VITAL SIGNS: ED Triage Vitals  Enc Vitals Group     BP 10/15/17 1409 122/80     Pulse Rate 10/15/17 1409 80     Resp 10/15/17 1409 20     Temp 10/15/17 1409 98.3 F (36.8 C)     Temp Source 10/15/17 1409 Oral     SpO2 10/15/17 1409 95 %     Weight 10/15/17 1410 259 lb (117.5 kg)     Height 10/15/17 1410 6' (1.829 m)     Head Circumference --      Peak Flow --      Pain Score 10/15/17 1410 4     Pain Loc --      Pain Edu? --       Excl. in GC? --      Constitutional: Alert and oriented. Well appearing and in no acute distress. Eyes: Conjunctivae are normal. PERRL. EOMI. Head: Atraumatic. Cardiovascular: Normal rate, regular rhythm. Normal S1 and S2.  Good peripheral circulation. Respiratory: Normal respiratory effort without tachypnea or retractions. Lungs CTAB. Good air entry to the bases with no decreased or absent breath sounds. Gastrointestinal: Bowel sounds 4 quadrants. Soft and nontender to palpation. No guarding or rigidity. No palpable masses. No distention. No CVA tenderness. Musculoskeletal: Full range of motion to all extremities. No gross deformities appreciated.  Patient has erythema and edema of the bilateral great toes.  Tophus of right great toe visualized. Neurologic:  Normal speech and language. No gross focal neurologic deficits are appreciated.  Skin:  Skin is warm, dry and intact. No rash noted. Psychiatric: Mood and affect are normal. Speech and behavior are normal. Patient exhibits appropriate insight and judgement.   ____________________________________________   LABS (all labs ordered are listed, but only abnormal results are displayed)  Labs Reviewed  URIC ACID   ____________________________________________  EKG   ____________________________________________  RADIOLOGY   No results found.  ____________________________________________    PROCEDURES  Procedure(s) performed:    Procedures    Medications - No data to display   ____________________________________________   INITIAL IMPRESSION / ASSESSMENT AND PLAN / ED COURSE  Pertinent labs & imaging results that were available during my care of the patient were reviewed by me and considered in my medical decision making (see chart for details).  Review of the [place] CSRS was performed in accordance of the [facility] prior to dispensing any controlled drugs.    Assessment and Plan:  Gout:  Patient presents to the  emergency department with erythema and edema of the bilateral great toes with tophus visualized.  Uric acid level was elevated in the emergency department.  Patient was discharged with a short course of prednisone and advised to follow-up with primary care.  Patient was advised to reduce consumption of red meat, seafood and daily alcohol intake in order to reduce number of subsequent gout flares.  All patient questions were answered.   ____________________________________________  FINAL CLINICAL IMPRESSION(S) / ED DIAGNOSES  Final diagnoses:  Acute gout involving toe of right foot, unspecified cause      NEW MEDICATIONS STARTED DURING THIS VISIT:  ED Discharge Orders        Ordered    predniSONE (DELTASONE) 50 MG tablet     10/15/17 1717          This chart was dictated using voice recognition software/Dragon. Despite best efforts to proofread, errors can occur which can change the meaning. Any change  was purely unintentional.    Orvil Feil, PA-C 10/15/17 1849    Sharman Cheek, MD 10/16/17 [phone]

## 2017-10-15 NOTE — ED Triage Notes (Signed)
Bilateral great toe pain x 1 week. Both toes noted swollen an d reddened. Denies injury. Denies know history of gout.

## 2019-09-05 ENCOUNTER — Other Ambulatory Visit: Payer: Self-pay

## 2019-09-05 ENCOUNTER — Encounter (INDEPENDENT_AMBULATORY_CARE_PROVIDER_SITE_OTHER): Payer: Self-pay | Admitting: Ophthalmology

## 2019-09-05 ENCOUNTER — Ambulatory Visit (INDEPENDENT_AMBULATORY_CARE_PROVIDER_SITE_OTHER): Payer: Medicare Other | Admitting: Ophthalmology

## 2019-09-05 DIAGNOSIS — H353124 Nonexudative age-related macular degeneration, left eye, advanced atrophic with subfoveal involvement: Secondary | ICD-10-CM | POA: Insufficient documentation

## 2019-09-05 DIAGNOSIS — H353221 Exudative age-related macular degeneration, left eye, with active choroidal neovascularization: Secondary | ICD-10-CM

## 2019-09-05 DIAGNOSIS — H353212 Exudative age-related macular degeneration, right eye, with inactive choroidal neovascularization: Secondary | ICD-10-CM

## 2019-09-05 DIAGNOSIS — H35351 Cystoid macular degeneration, right eye: Secondary | ICD-10-CM | POA: Insufficient documentation

## 2019-09-05 DIAGNOSIS — H35723 Serous detachment of retinal pigment epithelium, bilateral: Secondary | ICD-10-CM | POA: Insufficient documentation

## 2019-09-05 DIAGNOSIS — H353123 Nonexudative age-related macular degeneration, left eye, advanced atrophic without subfoveal involvement: Secondary | ICD-10-CM

## 2019-09-05 HISTORY — DX: Exudative age-related macular degeneration, right eye, with inactive choroidal neovascularization: H35.3212

## 2019-09-05 MED ORDER — BEVACIZUMAB CHEMO INJECTION 1.25MG/0.05ML SYRINGE FOR KALEIDOSCOPE
1.2500 mg | INTRAVITREAL | Status: AC | PRN
  Administered 2019-09-05: 1.25 mg via INTRAVITREAL

## 2019-09-05 NOTE — Progress Notes (Signed)
09/05/2019     CHIEF COMPLAINT Patient presents for Retina Follow Up   HISTORY OF PRESENT ILLNESS: Juan Reed is a 74 y.o. male who presents to the clinic today for:   HPI    Retina Follow Up    Patient presents with  Wet AMD.  In left eye.  Severity is moderate.  Since onset it is stable.  I, the attending physician,  performed the HPI with the patient and updated documentation appropriately.          Comments    8 Week AMD f\u OS. Possible Avastin OS. OCT  Pt states no changes or issues.Using gtts as directed.       Last edited by [name] on 09/05/2019 10:27 AM. (History)      Referring physician: Edmon Crape, MD [address] [place],  [place] [address]  HISTORICAL INFORMATION:   Selected notes from the MEDICAL RECORD NUMBER       CURRENT MEDICATIONS: Current Outpatient Medications (Ophthalmic Drugs)  Medication Sig  . ranibizumab (LUCENTIS) 0.5 MG/0.05ML SOLN 0.5 mg by Intravitreal route every 3 (three) months. Alternates eyes between  . timolol (TIMOPTIC) 0.5 % ophthalmic solution    No current facility-administered medications for this visit. (Ophthalmic Drugs)   Current Outpatient Medications (Other)  Medication Sig  . aspirin EC 325 MG EC tablet Take 1 tablet (325 mg total) by mouth 2 (two) times daily after a meal.  . losartan-hydrochlorothiazide (HYZAAR) 100-25 MG per tablet Take 1 tablet by mouth every morning.  . Multiple Vitamins-Minerals (CENTRUM SILVER ULTRA MENS PO) Take 1 tablet by mouth.  Marland Kitchen OVER THE COUNTER MEDICATION Lutein 25mg   With Zeaxanthin  TAB dAILY  . oxyCODONE-acetaminophen (ROXICET) 5-325 MG per tablet Take 1-2 tablets by mouth every 4 (four) hours as needed for severe pain.  . predniSONE (DELTASONE) 50 MG tablet Take one 50 mg tablet once daily for the next five days.   No current facility-administered medications for this visit. (Other)      REVIEW OF SYSTEMS:    ALLERGIES No Known Allergies  PAST MEDICAL  HISTORY Past Medical History:  Diagnosis Date  . Arthritis   . COPD (chronic obstructive pulmonary disease) (HCC) 6/15   per chest x ray  . Hyperlipidemia, mixed   . Hypertension   . Macular degeneration of both eyes 2012   receiving injections   Past Surgical History:  Procedure Laterality Date  . COLONOSCOPY     x 3  . NASAL POLYP SURGERY    . 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 No pertinent family history.  SOCIAL HISTORY Social History   Tobacco Use  . Smoking status: Former Smoker    Packs/day: 1.00    Years: 10.00    Pack years: 10.00    Quit date: 10/23/1979    Years since quitting: 39.8  . Smokeless tobacco: Never Used  Substance Use Topics  . Alcohol use: Yes    Comment: "750 ml week(some weeks) liquor"  . Drug use: No         OPHTHALMIC EXAM:  Base Eye Exam    Visual Acuity (Snellen - Linear)      Right Left   Dist cc 20/40 -1 20/400   Dist ph cc NI NI  Tonometry (Tonopen, 10:31 AM)      Right Left   Pressure 18 20       Pupils      Pupils Dark Light Shape React APD   Right PERRL 3.5 2 Round Brisk None   Left PERRL 3.5 2 Round Brisk None       Visual Fields (Counting fingers)      Left Right    Full Full       Neuro/Psych    Oriented x3: Yes   Mood/Affect: Normal       Dilation    Left eye: 1.0% Mydriacyl, 2.5% Phenylephrine @ 10:31 AM        Slit Lamp and Fundus Exam    External Exam      Right Left   External Normal Normal       Fundus Exam      Right Left   Posterior Vitreous  Posterior vitreous detachment, Central vitreous floaters   Disc  Normal   C/D Ratio  0.7   Macula  Hemorrhage, Disciform scar, Retinal pigment epithelial detachment, Macular thickening, Cystoid macular edema, Advanced age related macular  degeneration, Geographic atrophy   Vessels  Normal   Periphery  Normal          IMAGING AND PROCEDURES  Imaging and Procedures for 09/05/19  OCT, Retina - OU - Both Eyes       Right Eye Quality was borderline. Scan locations included subfoveal. Central Foveal Thickness: 232. Progression has been stable. Findings include abnormal foveal contour, pigment epithelial detachment, subretinal scarring, no IRF, no SRF.   Left Eye Quality was good. Scan locations included subfoveal. Central Foveal Thickness: 373. Progression has been stable. Findings include abnormal foveal contour, pigment epithelial detachment, subretinal scarring, central retinal atrophy, outer retinal atrophy, intraretinal fluid.   Notes OD, with old CNVM, old RPE rep, stable condition now 4 years will continue to observe,  OS with complex lesion anatomy, subfoveal PED, subretinal fibrosis and hemorrhage.       Intravitreal Injection, Pharmacologic Agent - OS - Left Eye       Time Out 09/05/2019. 11:53 AM. Confirmed correct patient, procedure, site, and patient consented.   Anesthesia Topical anesthesia was used. Anesthetic medications included Akten 3.5%.   Procedure Preparation included Tobramycin 0.3%, 10% betadine to eyelids, Ofloxacin . A 30 gauge needle was used.   Injection:  1.25 mg Bevacizumab (AVASTIN) SOLN   NDC: [address]-2, Lot: [id number]   Route: Intravitreal, Site: Left Eye, Waste: 0 mg  Post-op Post injection exam found visual acuity of at least counting fingers. The patient tolerated the procedure well. There were no complications. The patient received written and verbal post procedure care education. Post injection medications were not given.                 ASSESSMENT/PLAN:  Exudative age-related macular degeneration of left eye with active choroidal neovascularization (HCC) The nature of wet macular degeneration was discussed with the patient.  Forms of therapy reviewed include  the use of Anti-VEGF medications injected painlessly into the eye, as well as other possible treatment modalities, including thermal laser therapy. Fellow eye involvement and risks were discussed with the patient. Upon the finding of wet age related macular degeneration, treatment will be offered. The treatment regimen is on a treat as needed basis with the intent to treat if necessary and extend interval of exams when possible. On average 1 out of 6 patients do not need lifetime therapy. However, the  risk of recurrent disease is high for a lifetime.  Initially monthly, then periodic, examinations and evaluations will determine whether the next treatment is required on the day of the examination. OS, lesion complex is stable at the current interval of examination 8 weeks.  We will repeat intravitreal Avastin today      ICD-10-CM   1. Exudative age-related macular degeneration of left eye with active choroidal neovascularization (HCC)  H35.3221 OCT, Retina - OU - Both Eyes    Intravitreal Injection, Pharmacologic Agent - OS - Left Eye    Bevacizumab (AVASTIN) SOLN 1.25 mg  2. Exudative age-related macular degeneration of right eye with inactive choroidal neovascularization (HCC)  H35.3212 OCT, Retina - OU - Both Eyes  3. 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 Cystoid macular edema of right eye  H35.351   5. 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  We will repeat intravitreal Avastin today  2.  3.  Ophthalmic Meds Ordered this visit:  Meds ordered this encounter  Medications  . Bevacizumab (AVASTIN) SOLN 1.25 mg       No follow-ups on file.  There are no Patient Instructions on file for this visit.   Explained the diagnoses, plan, and follow up with the patient and they expressed understanding.  Patient  expressed understanding of the importance of proper follow up care.   Alford Highland Maili Shutters M.D. Diseases & Surgery of the Retina and Vitreous [facility] 09/05/19     Abbreviations: M myopia (nearsighted); A astigmatism; H hyperopia (farsighted); P presbyopia; Mrx spectacle prescription;  CTL contact lenses; OD right eye; OS left eye; OU both eyes  XT exotropia; ET esotropia; PEK punctate epithelial keratitis; PEE punctate epithelial erosions; DES dry eye syndrome; MGD meibomian gland dysfunction; ATs artificial tears; PFAT's preservative free artificial tears; NSC nuclear sclerotic cataract; PSC posterior subcapsular cataract; ERM epi-retinal membrane; PVD posterior vitreous detachment; RD retinal detachment; DM diabetes mellitus; DR diabetic retinopathy; NPDR non-proliferative diabetic retinopathy; PDR proliferative diabetic retinopathy; CSME clinically significant macular edema; DME diabetic macular edema; dbh dot blot hemorrhages; CWS cotton wool spot; POAG primary open angle glaucoma; C/D cup-to-disc ratio; HVF humphrey visual field; GVF goldmann visual field; OCT optical coherence tomography; IOP intraocular pressure; BRVO Branch retinal vein occlusion; CRVO central retinal vein occlusion; CRAO central retinal artery occlusion; BRAO branch retinal artery occlusion; RT retinal tear; SB scleral buckle; PPV pars plana vitrectomy; VH Vitreous hemorrhage; PRP panretinal laser photocoagulation; IVK intravitreal kenalog; VMT vitreomacular traction; MH Macular hole;  NVD neovascularization of the disc; NVE neovascularization elsewhere; AREDS age related eye disease study; ARMD age related macular degeneration; POAG primary open angle glaucoma; EBMD epithelial/anterior basement membrane dystrophy; ACIOL anterior chamber intraocular lens; IOL intraocular lens; PCIOL posterior chamber intraocular lens; Phaco/IOL phacoemulsification with intraocular lens placement; PRK photorefractive keratectomy;  LASIK laser assisted in situ keratomileusis; HTN hypertension; DM diabetes mellitus; COPD chronic obstructive pulmonary disease

## 2019-09-05 NOTE — Assessment & Plan Note (Signed)
The nature of wet macular degeneration was discussed with the patient.  Forms of therapy reviewed include the use of Anti-VEGF medications injected painlessly into the eye, as well as other possible treatment modalities, including thermal laser therapy. Fellow eye involvement and risks were discussed with the patient. Upon the finding of wet age related macular degeneration, treatment will be offered. The treatment regimen is on a treat as needed basis with the intent to treat if necessary and extend interval of exams when possible. On average 1 out of 6 patients do not need lifetime therapy. However, the risk of recurrent disease is high for a lifetime.  Initially monthly, then periodic, examinations and evaluations will determine whether the next treatment is required on the day of the examination. OS, lesion complex is stable at the current interval of examination 8 weeks.  We will repeat intravitreal Avastin today

## 2019-10-31 ENCOUNTER — Encounter (INDEPENDENT_AMBULATORY_CARE_PROVIDER_SITE_OTHER): Payer: Self-pay | Admitting: Ophthalmology

## 2019-10-31 ENCOUNTER — Ambulatory Visit (INDEPENDENT_AMBULATORY_CARE_PROVIDER_SITE_OTHER): Payer: Medicare Other | Admitting: Ophthalmology

## 2019-10-31 ENCOUNTER — Other Ambulatory Visit: Payer: Self-pay

## 2019-10-31 DIAGNOSIS — H353221 Exudative age-related macular degeneration, left eye, with active choroidal neovascularization: Secondary | ICD-10-CM

## 2019-10-31 DIAGNOSIS — H353212 Exudative age-related macular degeneration, right eye, with inactive choroidal neovascularization: Secondary | ICD-10-CM

## 2019-10-31 MED ORDER — BEVACIZUMAB CHEMO INJECTION 1.25MG/0.05ML SYRINGE FOR KALEIDOSCOPE
1.2500 mg | INTRAVITREAL | Status: AC | PRN
  Administered 2019-10-31: 1.25 mg via INTRAVITREAL

## 2019-10-31 NOTE — Assessment & Plan Note (Signed)
Today at 8-week examination still with less active CNVM and less intraretinal hemorrhage and fluid, post intravitreal Avastin will repeat injection OS today and examination in 10 weeks

## 2019-10-31 NOTE — Progress Notes (Addendum)
10/31/2019     CHIEF COMPLAINT Patient presents for Retina Follow Up   HISTORY OF PRESENT ILLNESS: Juan Reed is a 74 y.o. male who presents to the clinic today for:   HPI    Retina Follow Up    Patient presents with  Wet AMD.  In left eye.  This started 8 weeks ago.  Severity is mild.  Duration of 8 weeks.  Since onset it is stable.          Comments    8 Week AMD F/U OS, poss Avastin OS  Pt denies noticeable changes to [place] OU since last visit. Pt denies ocular pain, flashes of light, or floaters OU.         Last edited by Ileana Roup, COA on 10/31/2019 10:37 AM. (History)      Referring physician: Edmon Crape, MD [address] [place],  [place] [address]  HISTORICAL INFORMATION:   Selected notes from the MEDICAL RECORD NUMBER       CURRENT MEDICATIONS: Current Outpatient Medications (Ophthalmic Drugs)  Medication Sig  . ranibizumab (LUCENTIS) 0.5 MG/0.05ML SOLN 0.5 mg by Intravitreal route every 3 (three) months. Alternates eyes between  . timolol (TIMOPTIC) 0.5 % ophthalmic solution    No current facility-administered medications for this visit. (Ophthalmic Drugs)   Current Outpatient Medications (Other)  Medication Sig  . aspirin EC 325 MG EC tablet Take 1 tablet (325 mg total) by mouth 2 (two) times daily after a meal.  . losartan-hydrochlorothiazide (HYZAAR) 100-25 MG per tablet Take 1 tablet by mouth every morning.  . Multiple Vitamins-Minerals (CENTRUM SILVER ULTRA MENS PO) Take 1 tablet by mouth.  Marland Kitchen OVER THE COUNTER MEDICATION Lutein 25mg   With Zeaxanthin  TAB dAILY  . oxyCODONE-acetaminophen (ROXICET) 5-325 MG per tablet Take 1-2 tablets by mouth every 4 (four) hours as needed for severe pain.  . predniSONE (DELTASONE) 50 MG tablet Take one 50 mg tablet once daily for the next five days.   No current facility-administered medications for this visit. (Other)      REVIEW OF SYSTEMS:    ALLERGIES No Known Allergies  PAST MEDICAL  HISTORY Past Medical History:  Diagnosis Date  . Arthritis   . COPD (chronic obstructive pulmonary disease) (HCC) 6/15   per chest x ray  . Hyperlipidemia, mixed   . Hypertension   . Macular degeneration of both eyes 2012   receiving injections   Past Surgical History:  Procedure Laterality Date  . COLONOSCOPY     x 3  . NASAL POLYP SURGERY    . 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 No pertinent family history.  SOCIAL HISTORY Social History   Tobacco Use  . Smoking status: Former Smoker    Packs/day: 1.00    Years: 10.00    Pack years: 10.00    Quit date: 10/23/1979    Years since quitting: 40.0  . Smokeless tobacco: Never Used  Substance Use Topics  . Alcohol use: Yes    Comment: "750 ml week(some weeks) liquor"  . Drug use: No         OPHTHALMIC EXAM:  Base Eye Exam    Visual Acuity (ETDRS)      Right Left   Dist cc 20/50 +1 20/200 -1   Dist ph cc 20/40 -2  NI   Correction: Glasses       Tonometry (Tonopen, 10:40 AM)      Right Left   Pressure 17 14       Pupils      Pupils Dark Light Shape React APD   Right PERRL 3 2 Round Brisk None   Left PERRL 3 2 Round Brisk None       Visual Fields (Counting fingers)      Left Right    Full Full       Extraocular Movement      Right Left    Full Full       Neuro/Psych    Oriented x3: Yes   Mood/Affect: Normal       Dilation    Both eyes: 1.0% Mydriacyl, 2.5% Phenylephrine @ 10:40 AM        Slit Lamp and Fundus Exam    External Exam      Right Left   External Normal Normal       Slit Lamp Exam      Right Left   Lids/Lashes Normal Normal   Conjunctiva/Sclera White and quiet White and quiet   Cornea Clear Clear   Anterior Chamber Deep and quiet Deep and quiet   Iris Round and reactive Round  and reactive   Lens Centered posterior chamber intraocular lens, PC clear Centered posterior chamber intraocular lens, PC clear   Anterior Vitreous Normal Normal       Fundus Exam      Right Left   Posterior Vitreous Posterior vitreous detachment, Central vitreous floaters Posterior vitreous detachment, Central vitreous floaters   Disc Normal Normal   C/D Ratio 0.45 0.65   Macula Hard drusen, Pigmented atrophy, Advanced age related macular degeneration, no exudates Hemorrhage, Disciform scar, Retinal pigment epithelial detachment, Macular thickening, Cystoid macular edema, Advanced age related macular degeneration, Geographic atrophy   Vessels Normal Normal   Periphery Normal Normal          IMAGING AND PROCEDURES  Imaging and Procedures for 11/02/19  This procedure is incorrectly labeled right eye went in fact the left eye was treated   Intravitreal Avastin OS today  Intravitreal Avastin delivered today after topical antibiotics applied, Betadine 10%, the usual from [employer] 0.05 mL with 30-gauge needle via the inferotemporal injection via the pars plana  No complications         ASSESSMENT/PLAN:  Exudative age-related macular degeneration of left eye with active choroidal neovascularization (HCC) Today at 8-week examination still with less active CNVM and less intraretinal hemorrhage and fluid, post intravitreal Avastin will repeat injection OS today and examination in 10 weeks  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 Exudative age-related macular degeneration of left eye with active choroidal neovascularization (HCC)  H35.3221 OCT, Retina - OU - Both Eyes    Intravitreal Injection, Pharmacologic Agent - OD - Right Eye    Bevacizumab (AVASTIN) SOLN 1.25 mg    Intravitreal Injection, Pharmacologic Agent - OS - Left Eye    Bevacizumab  (AVASTIN) SOLN 1.25 mg  2. Exudative age-related macular degeneration of right eye with inactive choroidal neovascularization ([place])  H35.3212     1.  Any reference to the right eye receiving treatment with intravitreal Avastin today is incorrect  2.  The left eye was treated today, June 23 with intravitreal Avastin  3.  Ophthalmic Meds  Ordered this visit:  Meds ordered this encounter  Medications  . Bevacizumab (AVASTIN) SOLN 1.25 mg  . Bevacizumab (AVASTIN) SOLN 1.25 mg       Return in about 10 weeks (around 01/09/2020) for OS, AVASTIN OCT.  There are no Patient Instructions on file for this visit.   Explained the diagnoses, plan, and follow up with the patient and they expressed understanding.  Patient expressed understanding of the importance of proper follow up care.   Alford Highland Kodie Kishi M.D. Diseases & Surgery of the Retina and Vitreous [facility] 11/02/19     Abbreviations: M myopia (nearsighted); A astigmatism; H hyperopia (farsighted); P presbyopia; Mrx spectacle prescription;  CTL contact lenses; OD right eye; OS left eye; OU both eyes  XT exotropia; ET esotropia; PEK punctate epithelial keratitis; PEE punctate epithelial erosions; DES dry eye syndrome; MGD meibomian gland dysfunction; ATs artificial tears; PFAT's preservative free artificial tears; NSC nuclear sclerotic cataract; PSC posterior subcapsular cataract; ERM epi-retinal membrane; PVD posterior vitreous detachment; RD retinal detachment; DM diabetes mellitus; DR diabetic retinopathy; NPDR non-proliferative diabetic retinopathy; PDR proliferative diabetic retinopathy; CSME clinically significant macular edema; DME diabetic macular edema; dbh dot blot hemorrhages; CWS cotton wool spot; POAG primary open angle glaucoma; C/D cup-to-disc ratio; HVF humphrey visual field; GVF goldmann visual field; OCT optical coherence tomography; IOP intraocular pressure; BRVO Branch retinal vein occlusion; CRVO central  retinal vein occlusion; CRAO central retinal artery occlusion; BRAO branch retinal artery occlusion; RT retinal tear; SB scleral buckle; PPV pars plana vitrectomy; VH Vitreous hemorrhage; PRP panretinal laser photocoagulation; IVK intravitreal kenalog; VMT vitreomacular traction; MH Macular hole;  NVD neovascularization of the disc; NVE neovascularization elsewhere; AREDS age related eye disease study; ARMD age related macular degeneration; POAG primary open angle glaucoma; EBMD epithelial/anterior basement membrane dystrophy; ACIOL anterior chamber intraocular lens; IOL intraocular lens; PCIOL posterior chamber intraocular lens; Phaco/IOL phacoemulsification with intraocular lens placement; PRK photorefractive keratectomy; LASIK laser assisted in situ keratomileusis; HTN hypertension; DM diabetes mellitus; COPD chronic obstructive pulmonary disease

## 2019-10-31 NOTE — Assessment & Plan Note (Signed)
No change OD, and active disease

## 2019-11-02 DIAGNOSIS — H353221 Exudative age-related macular degeneration, left eye, with active choroidal neovascularization: Secondary | ICD-10-CM | POA: Diagnosis not present

## 2019-11-02 MED ORDER — BEVACIZUMAB CHEMO INJECTION 1.25MG/0.05ML SYRINGE FOR KALEIDOSCOPE
1.2500 mg | INTRAVITREAL | Status: AC | PRN
  Administered 2019-11-02: 1.25 mg via INTRAVITREAL

## 2019-11-27 ENCOUNTER — Other Ambulatory Visit (INDEPENDENT_AMBULATORY_CARE_PROVIDER_SITE_OTHER): Payer: Self-pay | Admitting: Ophthalmology

## 2020-01-09 ENCOUNTER — Encounter (INDEPENDENT_AMBULATORY_CARE_PROVIDER_SITE_OTHER): Payer: Medicare Other | Admitting: Ophthalmology

## 2020-01-16 ENCOUNTER — Other Ambulatory Visit: Payer: Self-pay

## 2020-01-16 ENCOUNTER — Encounter (INDEPENDENT_AMBULATORY_CARE_PROVIDER_SITE_OTHER): Payer: Self-pay | Admitting: Ophthalmology

## 2020-01-16 ENCOUNTER — Ambulatory Visit (INDEPENDENT_AMBULATORY_CARE_PROVIDER_SITE_OTHER): Payer: Medicare Other | Admitting: Ophthalmology

## 2020-01-16 DIAGNOSIS — H353124 Nonexudative age-related macular degeneration, left eye, advanced atrophic with subfoveal involvement: Secondary | ICD-10-CM

## 2020-01-16 DIAGNOSIS — H353212 Exudative age-related macular degeneration, right eye, with inactive choroidal neovascularization: Secondary | ICD-10-CM

## 2020-01-16 DIAGNOSIS — H353221 Exudative age-related macular degeneration, left eye, with active choroidal neovascularization: Secondary | ICD-10-CM | POA: Diagnosis not present

## 2020-01-16 MED ORDER — BEVACIZUMAB CHEMO INJECTION 1.25MG/0.05ML SYRINGE FOR KALEIDOSCOPE
1.2500 mg | INTRAVITREAL | Status: AC | PRN
  Administered 2020-01-16: 1.25 mg via INTRAVITREAL

## 2020-01-16 NOTE — Assessment & Plan Note (Signed)
Account for visual acuity left eye

## 2020-01-16 NOTE — Progress Notes (Signed)
01/16/2020     CHIEF COMPLAINT Patient presents for Retina Follow Up   HISTORY OF PRESENT ILLNESS: Juan Reed is a 74 y.o. male who presents to the clinic today for:   HPI    Retina Follow Up    Patient presents with  Wet AMD.  In left eye.  Severity is moderate.  Duration of 11 weeks.  Since onset it is stable.  I, the attending physician,  performed the HPI with the patient and updated documentation appropriately.          Comments    11 Week Wet AMD f\u OS. Possible Avastin OS. OCT  Pt states vision is stable. Denies any complaints.       Last edited by [name] on 01/16/2020  9:52 AM. (History)      Referring physician: No referring provider defined for this encounter.  HISTORICAL INFORMATION:   Selected notes from the MEDICAL RECORD NUMBER       CURRENT MEDICATIONS: Current Outpatient Medications (Ophthalmic Drugs)  Medication Sig  . ranibizumab (LUCENTIS) 0.5 MG/0.05ML SOLN 0.5 mg by Intravitreal route every 3 (three) months. Alternates eyes between  . timolol (TIMOPTIC) 0.5 % ophthalmic solution INSTILL 1 DROP IN LEFT EYE DAILY AS DIRECTED   No current facility-administered medications for this visit. (Ophthalmic Drugs)   Current Outpatient Medications (Other)  Medication Sig  . aspirin EC 325 MG EC tablet Take 1 tablet (325 mg total) by mouth 2 (two) times daily after a meal.  . losartan-hydrochlorothiazide (HYZAAR) 100-25 MG per tablet Take 1 tablet by mouth every morning.  . Multiple Vitamins-Minerals (CENTRUM SILVER ULTRA MENS PO) Take 1 tablet by mouth.  Marland Kitchen OVER THE COUNTER MEDICATION Lutein 25mg   With Zeaxanthin  TAB dAILY  . oxyCODONE-acetaminophen (ROXICET) 5-325 MG per tablet Take 1-2 tablets by mouth every 4 (four) hours as needed for severe pain.  . predniSONE (DELTASONE) 50 MG tablet Take one 50 mg tablet once daily for the next five days.   No current facility-administered medications for this visit. (Other)      REVIEW OF  SYSTEMS:    ALLERGIES No Known Allergies  PAST MEDICAL HISTORY Past Medical History:  Diagnosis Date  . Arthritis   . COPD (chronic obstructive pulmonary disease) (HCC) 6/15   per chest x ray  . Hyperlipidemia, mixed   . Hypertension   . Macular degeneration of both eyes 2012   receiving injections   Past Surgical History:  Procedure Laterality Date  . COLONOSCOPY     x 3  . NASAL POLYP SURGERY    . 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 No pertinent family history.  SOCIAL HISTORY Social History   Tobacco Use  . Smoking status: Former Smoker    Packs/day: 1.00    Years: 10.00    Pack years: 10.00    Quit date: 10/23/1979    Years since quitting: 40.2  . Smokeless tobacco: Never Used  Substance Use Topics  . Alcohol use: Yes    Comment: "750 ml week(some weeks) liquor"  . Drug use: No         OPHTHALMIC EXAM:  Base Eye Exam    Visual Acuity (Snellen - Linear)      Right Left   Dist cc 20/50 E Card @ 6'  Dist ph cc 20/50 + NI   Correction: Glasses       Tonometry (Tonopen, 9:56 AM)      Right Left   Pressure 20 18       Pupils      Pupils Dark Light Shape React APD   Right PERRL 3 2 Round Brisk None   Left PERRL 3 2 Round Brisk None       Visual Fields (Counting fingers)      Left Right    Full Full       Neuro/Psych    Oriented x3: Yes   Mood/Affect: Normal       Dilation    Left eye: 1.0% Mydriacyl, 2.5% Phenylephrine @ 9:56 AM        Slit Lamp and Fundus Exam    External Exam      Right Left   External Normal Normal       Slit Lamp Exam      Right Left   Lids/Lashes Normal Normal   Conjunctiva/Sclera White and quiet White and quiet   Cornea Clear Clear   Anterior Chamber Deep and quiet Deep and quiet   Iris Round and reactive  Round and reactive   Lens Centered posterior chamber intraocular lens, PC clear Centered posterior chamber intraocular lens, PC clear   Anterior Vitreous Normal Normal       Fundus Exam      Right Left   Posterior Vitreous  Posterior vitreous detachment, Central vitreous floaters   Disc  Normal   C/D Ratio  0.65   Macula  No Hemorrhage, Disciform scar, Retinal pigment epithelial detachment, Macular thickening, Cystoid macular edema, Advanced age related macular degeneration, Geographic atrophy   Vessels  Normal   Periphery  Normal          IMAGING AND PROCEDURES  Imaging and Procedures for 01/16/20  OCT, Retina - OU - Both Eyes       Right Eye Quality was good. Central Foveal Thickness: 224. Findings include abnormal foveal contour, subretinal hyper-reflective material.   Left Eye Quality was good. Scan locations included subfoveal. Central Foveal Thickness: 311. Findings include abnormal foveal contour, disciform scar, subretinal scarring, subretinal hyper-reflective material.   Notes OS, active CNVM disciform lesion, stable however at 10-week follow-up and will repeat injection OS today  OD with no active CNVM       Intravitreal Injection, Pharmacologic Agent - OS - Left Eye       Time Out 01/16/2020. 11:17 AM. Confirmed correct patient, procedure, site, and patient consented.   Anesthesia Topical anesthesia was used. Anesthetic medications included Akten 3.5%.   Procedure Preparation included Tobramycin 0.3%, 10% betadine to eyelids. A 30 gauge needle was used.   Injection:  1.25 mg Bevacizumab (AVASTIN) SOLN   NDC: [address]-7, Lot: [id number]   Route: Intravitreal, Site: Left Eye, Waste: 0 mg  Post-op Post injection exam found visual acuity of at least counting fingers. The patient tolerated the procedure well. There were no complications. The patient received written and verbal post procedure care education. Post injection medications were not given.                  ASSESSMENT/PLAN:  Exudative age-related macular degeneration of left eye with active choroidal neovascularization (HCC) Complex disciform scar centrally with old subretinal hemorrhage, fibrosis, large RPE rip.  Acuity and anatomy of the left eye is stabilized on intravitreal Avastin currently at 10-week follow-up interval.  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 Exudative age-related macular degeneration of left eye with active choroidal neovascularization (HCC)  H35.3221 OCT, Retina - OU - Both Eyes    Intravitreal Injection, Pharmacologic Agent - OS - Left Eye    Bevacizumab (AVASTIN) SOLN 1.25 mg  2. Advanced nonexudative age-related macular degeneration of left eye with subfoveal involvement  H35.3124   3. 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  OS repeat injection intravitreal Avastin today at 10-week follow-up visit and examination OU next 2.  Dilate OU next in 10 weeks, likely injection intravitreal Avastin OS  3.  Ophthalmic Meds Ordered this visit:  Meds ordered this encounter  Medications  . Bevacizumab (AVASTIN) SOLN 1.25 mg       Return in about 10 weeks (around 03/26/2020) for DILATE OU, AVASTIN OCT, OS.  There are no Patient Instructions on file for this visit.   Explained the diagnoses, plan, and follow up with the patient and they expressed understanding.  Patient expressed understanding of the importance of proper follow up care.   Alford Highland Karsen Nakanishi M.D. Diseases & Surgery of the  Retina and Vitreous [facility] 01/16/20     Abbreviations: M myopia (nearsighted); A astigmatism; H hyperopia (farsighted); P presbyopia; Mrx spectacle prescription;  CTL contact lenses; OD right eye; OS left eye; OU both eyes  XT exotropia; ET esotropia; PEK punctate epithelial keratitis; PEE punctate epithelial erosions; DES dry eye syndrome; MGD meibomian gland dysfunction; ATs artificial tears; PFAT's preservative free artificial tears; NSC nuclear sclerotic cataract; PSC posterior subcapsular cataract; ERM epi-retinal membrane; PVD posterior vitreous detachment; RD retinal detachment; DM diabetes mellitus; DR diabetic retinopathy; NPDR non-proliferative diabetic retinopathy; PDR proliferative diabetic retinopathy; CSME clinically significant macular edema; DME diabetic macular edema; dbh dot blot hemorrhages; CWS cotton wool spot; POAG primary open angle glaucoma; C/D cup-to-disc ratio; HVF humphrey visual field; GVF goldmann visual field; OCT optical coherence tomography; IOP intraocular pressure; BRVO Branch retinal vein occlusion; CRVO central retinal vein occlusion; CRAO central retinal artery occlusion; BRAO branch retinal artery occlusion; RT retinal tear; SB scleral buckle; PPV pars plana vitrectomy; VH Vitreous hemorrhage; PRP panretinal laser photocoagulation; IVK intravitreal kenalog; VMT vitreomacular traction; MH Macular hole;  NVD neovascularization of the disc; NVE neovascularization elsewhere; AREDS age related eye disease study; ARMD age related macular degeneration; POAG primary open angle glaucoma; EBMD epithelial/anterior basement membrane dystrophy; ACIOL anterior chamber intraocular lens; IOL intraocular lens; PCIOL posterior chamber intraocular lens; Phaco/IOL phacoemulsification with intraocular lens placement; PRK photorefractive keratectomy; LASIK laser assisted in situ keratomileusis; HTN hypertension; DM diabetes mellitus; COPD chronic obstructive pulmonary  disease

## 2020-01-16 NOTE — Assessment & Plan Note (Signed)
OD remained stable, with old RPE ribbon complex PED, will observe today and examination OD and OS in 10 weeks

## 2020-01-16 NOTE — Assessment & Plan Note (Signed)
Complex disciform scar centrally with old subretinal hemorrhage, fibrosis, large RPE rip.  Acuity and anatomy of the left eye is stabilized on intravitreal Avastin currently at 10-week follow-up interval.  We will repeat today and examination of each eye in 10 weeks

## 2020-03-26 ENCOUNTER — Encounter (INDEPENDENT_AMBULATORY_CARE_PROVIDER_SITE_OTHER): Payer: Self-pay | Admitting: Ophthalmology

## 2020-03-26 ENCOUNTER — Other Ambulatory Visit: Payer: Self-pay

## 2020-03-26 ENCOUNTER — Ambulatory Visit (INDEPENDENT_AMBULATORY_CARE_PROVIDER_SITE_OTHER): Payer: Medicare Other | Admitting: Ophthalmology

## 2020-03-26 DIAGNOSIS — H353221 Exudative age-related macular degeneration, left eye, with active choroidal neovascularization: Secondary | ICD-10-CM | POA: Diagnosis not present

## 2020-03-26 DIAGNOSIS — H353212 Exudative age-related macular degeneration, right eye, with inactive choroidal neovascularization: Secondary | ICD-10-CM

## 2020-03-26 DIAGNOSIS — H353124 Nonexudative age-related macular degeneration, left eye, advanced atrophic with subfoveal involvement: Secondary | ICD-10-CM

## 2020-03-26 MED ORDER — BEVACIZUMAB CHEMO INJECTION 1.25MG/0.05ML SYRINGE FOR KALEIDOSCOPE
1.2500 mg | INTRAVITREAL | Status: AC | PRN
Start: 2020-03-26 — End: 2020-03-26
  Administered 2020-03-26: 1.25 mg via INTRAVITREAL

## 2020-03-26 NOTE — Progress Notes (Signed)
03/26/2020     CHIEF COMPLAINT Patient presents for Retina Follow Up   HISTORY OF PRESENT ILLNESS: Juan Reed is a 74 y.o. male who presents to the clinic today for:   HPI    Retina Follow Up    Patient presents with  Wet AMD.  In left eye.  This started 10 weeks ago.  Severity is mild.  Duration of 10 weeks.  Since onset it is gradually worsening.          Comments    10 Week AMD F/U OU, poss Avastin OS  Pt sts, "I'm seeing less and less out of my right eye." No changes OS       Last edited by Ileana Roup, COA on 03/26/2020 10:29 AM. (History)      Referring physician: No referring provider defined for this encounter.  HISTORICAL INFORMATION:   Selected notes from the MEDICAL RECORD NUMBER       CURRENT MEDICATIONS: Current Outpatient Medications (Ophthalmic Drugs)  Medication Sig  . timolol (TIMOPTIC) 0.5 % ophthalmic solution INSTILL 1 DROP IN LEFT EYE DAILY AS DIRECTED  . ranibizumab (LUCENTIS) 0.5 MG/0.05ML SOLN 0.5 mg by Intravitreal route every 3 (three) months. Alternates eyes between   No current facility-administered medications for this visit. (Ophthalmic Drugs)   Current Outpatient Medications (Other)  Medication Sig  . aspirin EC 325 MG EC tablet Take 1 tablet (325 mg total) by mouth 2 (two) times daily after a meal.  . losartan-hydrochlorothiazide (HYZAAR) 100-25 MG per tablet Take 1 tablet by mouth every morning.  . Multiple Vitamins-Minerals (CENTRUM SILVER ULTRA MENS PO) Take 1 tablet by mouth.  Marland Kitchen OVER THE COUNTER MEDICATION Lutein 25mg   With Zeaxanthin  TAB dAILY  . oxyCODONE-acetaminophen (ROXICET) 5-325 MG per tablet Take 1-2 tablets by mouth every 4 (four) hours as needed for severe pain.  . predniSONE (DELTASONE) 50 MG tablet Take one 50 mg tablet once daily for the next five days.   No current facility-administered medications for this visit. (Other)      REVIEW OF SYSTEMS:    ALLERGIES No Known Allergies  PAST MEDICAL  HISTORY Past Medical History:  Diagnosis Date  . Arthritis   . COPD (chronic obstructive pulmonary disease) (HCC) 6/15   per chest x ray  . Hyperlipidemia, mixed   . Hypertension   . Macular degeneration of both eyes 2012   receiving injections   Past Surgical History:  Procedure Laterality Date  . COLONOSCOPY     x 3  . NASAL POLYP SURGERY    . 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 No pertinent family history.  SOCIAL HISTORY Social History   Tobacco Use  . Smoking status: Former Smoker    Packs/day: 1.00    Years: 10.00    Pack years: 10.00    Quit date: 10/23/1979    Years since quitting: 40.4  . Smokeless tobacco: Never Used  Substance Use Topics  . Alcohol use: Yes    Comment: "750 ml week(some weeks) liquor"  . Drug use: No         OPHTHALMIC EXAM:  Base Eye Exam    Visual Acuity (ETDRS)      Right Left   Dist cc 20/50 20/400   Dist ph cc NI NI   Correction: Glasses  Tonometry (Tonopen, 10:29 AM)      Right Left   Pressure 12 14       Pupils      Pupils Dark Light Shape React APD   Right PERRL 3 2 Round Brisk None   Left PERRL 3 2 Round Brisk None       Visual Fields (Counting fingers)      Left Right    Full Full       Extraocular Movement      Right Left    Full Full       Neuro/Psych    Oriented x3: Yes   Mood/Affect: Normal       Dilation    Both eyes: 1.0% Mydriacyl, 2.5% Phenylephrine @ 10:32 AM        Slit Lamp and Fundus Exam    External Exam      Right Left   External Normal Normal       Slit Lamp Exam      Right Left   Lids/Lashes Normal Normal   Conjunctiva/Sclera White and quiet White and quiet   Cornea Clear Clear   Anterior Chamber Deep and quiet Deep and quiet   Iris Round and reactive Round and reactive     Lens Centered posterior chamber intraocular lens, PC clear Centered posterior chamber intraocular lens, PC clear   Anterior Vitreous Normal Normal       Fundus Exam      Right Left   Posterior Vitreous Posterior vitreous detachment Posterior vitreous detachment, Central vitreous floaters   Disc Normal Normal   C/D Ratio 0.45 0.6   Macula Geographic atrophy, no macular thickening, no hemorrhage, no exudates No Hemorrhage, Disciform scar, Retinal pigment epithelial detachment, Macular thickening, Cystoid macular edema, Advanced age related macular degeneration, Geographic atrophy   Vessels Normal Normal   Periphery Normal Normal          IMAGING AND PROCEDURES  Imaging and Procedures for 03/26/20  OCT, Retina - OU - Both Eyes       Right Eye Quality was good. Scan locations included subfoveal. Central Foveal Thickness: 230. Progression has been stable. Findings include no IRF, abnormal foveal contour, no SRF, subretinal hyper-reflective material.   Left Eye Quality was good. Scan locations included subfoveal. Central Foveal Thickness: 329. Progression has been stable. Findings include disciform scar, no IRF, no SRF, subretinal scarring.   Notes OS, much less active CNVM, subretinal scarring accounts for acuity.  Will repeat injection Avastin today and extend interval of examination to 12 weeks  OD with irregular macular contour secondary to prior active CNVM now resolved into RPE rip, and active pigment epithelial detachment and preserve subfoveal pigment       Intravitreal Injection, Pharmacologic Agent - OS - Left Eye       Time Out 03/26/2020. 11:35 AM. Confirmed correct patient, procedure, site, and patient consented.   Anesthesia Topical anesthesia was used. Anesthetic medications included Akten 3.5%.   Procedure Preparation included Tobramycin 0.3%, 10% betadine to eyelids. A 30 gauge needle was used.   Injection:  1.25 mg Bevacizumab (AVASTIN) SOLN   NDC:  70360-001-02, Lot: [id number]   Route: Intravitreal, Site: Left Eye, Waste: 0 mg  Post-op Post injection exam found visual acuity of at least counting fingers. The patient tolerated the procedure well. There were no complications. The patient received written and verbal post procedure care education. Post injection medications were not given.  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  Repeat Avastin for chronic disease activity in the past but extend the exam interval to 12 weeks      ICD-10-CM   1. Exudative age-related macular degeneration of left eye with active choroidal neovascularization (HCC)  H35.3221 OCT, Retina - OU - Both Eyes    Intravitreal Injection, Pharmacologic Agent - OS - Left Eye    Bevacizumab (AVASTIN) SOLN 1.25 mg  2. Advanced nonexudative age-related macular degeneration of left eye with subfoveal involvement  H35.3124 OCT, Retina - OU - Both Eyes  3. 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  Much less active CNVM left eye, on intravitreal Avastin now extending interval to 10 weeks, will repeat injection today and examination in 12 weeks  2.  3.  Ophthalmic Meds Ordered this visit:  Meds ordered this encounter  Medications  . Bevacizumab (AVASTIN) SOLN 1.25 mg       Return in about 3 months (around 06/26/2020) for COLOR FP, DILATE OU, AVASTIN OCT, OS.  There are no Patient Instructions on file for this visit.   Explained the diagnoses,  plan, and follow up with the patient and they expressed understanding.  Patient expressed understanding of the importance of proper follow up care.   Alford Highland Enjoli Tidd M.D. Diseases & Surgery of the Retina and Vitreous [facility] 03/26/20     Abbreviations: M myopia (nearsighted); A astigmatism; H hyperopia (farsighted); P presbyopia; Mrx spectacle prescription;  CTL contact lenses; OD right eye; OS left eye; OU both eyes  XT exotropia; ET esotropia; PEK punctate epithelial keratitis; PEE punctate epithelial erosions; DES dry eye syndrome; MGD meibomian gland dysfunction; ATs artificial tears; PFAT's preservative free artificial tears; NSC nuclear sclerotic cataract; PSC posterior subcapsular cataract; ERM epi-retinal membrane; PVD posterior vitreous detachment; RD retinal detachment; DM diabetes mellitus; DR diabetic retinopathy; NPDR non-proliferative diabetic retinopathy; PDR proliferative diabetic retinopathy; CSME clinically significant macular edema; DME diabetic macular edema; dbh dot blot hemorrhages; CWS cotton wool spot; POAG primary open angle glaucoma; C/D cup-to-disc ratio; HVF humphrey visual field; GVF goldmann visual field; OCT optical coherence tomography; IOP intraocular pressure; BRVO Branch retinal vein occlusion; CRVO central retinal vein occlusion; CRAO central retinal artery occlusion; BRAO branch retinal artery occlusion; RT retinal tear; SB scleral buckle; PPV pars plana vitrectomy; VH Vitreous hemorrhage; PRP panretinal laser photocoagulation; IVK intravitreal kenalog; VMT vitreomacular traction; MH Macular hole;  NVD neovascularization of the disc; NVE neovascularization elsewhere; AREDS age related eye disease study; ARMD age related macular degeneration; POAG primary open angle glaucoma; EBMD epithelial/anterior basement membrane dystrophy; ACIOL anterior chamber intraocular lens; IOL intraocular lens; PCIOL posterior chamber intraocular lens; Phaco/IOL  phacoemulsification with intraocular lens placement; PRK photorefractive keratectomy; LASIK laser assisted in situ keratomileusis; HTN hypertension; DM diabetes mellitus; COPD chronic obstructive pulmonary disease

## 2020-03-26 NOTE — Assessment & Plan Note (Signed)
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  Repeat Avastin for chronic disease activity in the past but extend the exam interval to 12 weeks

## 2020-03-26 NOTE — Assessment & Plan Note (Signed)
Stable, no treatment required OD at this time

## 2020-06-26 ENCOUNTER — Encounter (INDEPENDENT_AMBULATORY_CARE_PROVIDER_SITE_OTHER): Payer: Self-pay | Admitting: Ophthalmology

## 2020-06-26 ENCOUNTER — Other Ambulatory Visit: Payer: Self-pay

## 2020-06-26 ENCOUNTER — Ambulatory Visit (INDEPENDENT_AMBULATORY_CARE_PROVIDER_SITE_OTHER): Payer: Medicare Other | Admitting: Ophthalmology

## 2020-06-26 DIAGNOSIS — H353212 Exudative age-related macular degeneration, right eye, with inactive choroidal neovascularization: Secondary | ICD-10-CM | POA: Diagnosis not present

## 2020-06-26 DIAGNOSIS — H353221 Exudative age-related macular degeneration, left eye, with active choroidal neovascularization: Secondary | ICD-10-CM | POA: Diagnosis not present

## 2020-06-26 MED ORDER — BEVACIZUMAB 2.5 MG/0.1ML IZ SOSY
2.5000 mg | PREFILLED_SYRINGE | INTRAVITREAL | Status: AC | PRN
  Administered 2020-06-26: 2.5 mg via INTRAVITREAL

## 2020-06-26 NOTE — Assessment & Plan Note (Signed)
Stable right eye, with no inner vegF therapy required since midportion of 2018

## 2020-06-26 NOTE — Progress Notes (Signed)
06/26/2020     CHIEF COMPLAINT Patient presents for Retina Follow Up (3 Month f\u OU. Possible Avastin OS. OCT/Pt states OS vision has slightly declined. Denies any other complaints.)   HISTORY OF PRESENT ILLNESS: Juan Reed is a 75 y.o. male who presents to the clinic today for:   HPI    Retina Follow Up    Patient presents with  Wet AMD.  In left eye.  Severity is moderate.  Duration of 3 months.  Since onset it is stable.  I, the attending physician,  performed the HPI with the patient and updated documentation appropriately. Additional comments: 3 Month f\u OU. Possible Avastin OS. OCT Pt states OS vision has slightly declined. Denies any other complaints.       Last edited by [name] on 06/26/2020 10:38 AM. (History)      Referring physician: [name], MD [address] [place],  [place] [address]  HISTORICAL INFORMATION:   Selected notes from the MEDICAL RECORD NUMBER       CURRENT MEDICATIONS: Current Outpatient Medications (Ophthalmic Drugs)  Medication Sig  . ranibizumab (LUCENTIS) 0.5 MG/0.05ML SOLN 0.5 mg by Intravitreal route every 3 (three) months. Alternates eyes between  . timolol (TIMOPTIC) 0.5 % ophthalmic solution INSTILL 1 DROP IN LEFT EYE DAILY AS DIRECTED   No current facility-administered medications for this visit. (Ophthalmic Drugs)   Current Outpatient Medications (Other)  Medication Sig  . aspirin EC 325 MG EC tablet Take 1 tablet (325 mg total) by mouth 2 (two) times daily after a meal.  . losartan-hydrochlorothiazide (HYZAAR) 100-25 MG per tablet Take 1 tablet by mouth every morning.  . Multiple Vitamins-Minerals (CENTRUM SILVER ULTRA MENS PO) Take 1 tablet by mouth.  Marland Kitchen OVER THE COUNTER MEDICATION Lutein 25mg   With Zeaxanthin  TAB dAILY  . oxyCODONE-acetaminophen (ROXICET) 5-325 MG per tablet Take 1-2 tablets by mouth every 4 (four) hours as needed for severe pain.  . predniSONE (DELTASONE) 50 MG tablet Take one 50 mg  tablet once daily for the next five days.   No current facility-administered medications for this visit. (Other)      REVIEW OF SYSTEMS:    ALLERGIES No Known Allergies  PAST MEDICAL HISTORY Past Medical History:  Diagnosis Date  . Arthritis   . COPD (chronic obstructive pulmonary disease) (HCC) 6/15   per chest x ray  . Hyperlipidemia, mixed   . Hypertension   . Macular degeneration of both eyes 2012   receiving injections   Past Surgical History:  Procedure Laterality Date  . COLONOSCOPY     x 3  . NASAL POLYP SURGERY    . 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 No pertinent family history.  SOCIAL HISTORY Social History   Tobacco Use  . Smoking status: Former Smoker    Packs/day: 1.00    Years: 10.00    Pack years: 10.00    Quit date: 10/23/1979    Years since quitting: 40.7  . Smokeless tobacco: Never Used  Substance Use Topics  . Alcohol use: Yes    Comment: "750 ml week(some weeks) liquor"  . Drug use: No         OPHTHALMIC EXAM:  Base Eye Exam    Visual Acuity (Snellen - Linear)      Right  Left   Dist cc 20/50 +2 20/400   Dist ph cc 20/40 -1 NI   Correction: Glasses       Tonometry (Tonopen, 10:43 AM)      Right Left   Pressure 13 13       Pupils      Pupils Dark Light Shape React APD   Right PERRL 3 2 Round Brisk None   Left PERRL 3 2 Round Brisk None       Visual Fields (Counting fingers)      Left Right    Full Full       Neuro/Psych    Oriented x3: Yes   Mood/Affect: Normal       Dilation    Both eyes: 1.0% Mydriacyl, 2.5% Phenylephrine @ 10:43 AM        Slit Lamp and Fundus Exam    External Exam      Right Left   External Normal Normal       Slit Lamp Exam      Right Left   Lids/Lashes Normal Normal    Conjunctiva/Sclera White and quiet White and quiet   Cornea Clear Clear   Anterior Chamber Deep and quiet Deep and quiet   Iris Round and reactive Round and reactive   Lens Centered posterior chamber intraocular lens, PC clear Centered posterior chamber intraocular lens, PC clear   Anterior Vitreous Normal Normal       Fundus Exam      Right Left   Posterior Vitreous Posterior vitreous detachment Posterior vitreous detachment, Central vitreous floaters   Disc Normal Normal   C/D Ratio 0.45 0.7   Macula Geographic atrophy not in faz, no macular thickening, no hemorrhage, no exudates Small subretinal  Hemorrhage remains, Disciform scar, Retinal pigment epithelial detachment, Macular thickening, Cystoid macular edema, Advanced age related macular degeneration, Geographic atrophy   Vessels Normal Normal   Periphery Normal Normal          IMAGING AND PROCEDURES  Imaging and Procedures for 06/26/20  OCT, Retina - OU - Both Eyes       Right Eye Quality was good. Scan locations included subfoveal. Central Foveal Thickness: 231. Progression has been stable. Findings include abnormal foveal contour, disciform scar.   Left Eye Quality was good. Scan locations included subfoveal. Central Foveal Thickness: 308. Progression has been stable. Findings include disciform scar, no IRF, no SRF, subretinal scarring.   Notes Old disciform scar, RPE rip not in the fovea right eye, remained stable will observe  OS, now 3 months post injection intravitreal Avastin, much less retinal thickening, subretinal scarring accounts for acuity.  With clinically SR heme present in the macular region will repeat injection Avastin today to maintain and prevent scotoma enlargement       Intravitreal Injection, Pharmacologic Agent - OS - Left Eye       Time Out 06/26/2020. 11:15 AM. Confirmed correct patient, procedure, site, and patient consented.   Anesthesia Topical anesthesia was used. Anesthetic  medications included Akten 3.5%.   Procedure Preparation included Tobramycin 0.3%, 10% betadine to eyelids, 5% betadine to ocular surface. A 30 gauge needle was used.   Injection:  2.5 mg Bevacizumab (AVASTIN) 2.5mg /0.66mL SOSY   NDC: 16109-604-54, Lot: [id number]   Route: Intravitreal, Site: Left Eye  Post-op Post injection exam found visual acuity of at least counting fingers. The patient tolerated the procedure well. There were no complications. The patient received written and verbal post procedure care education. Post injection medications  were not given.                 ASSESSMENT/PLAN:  Exudative age-related macular degeneration of left eye with active choroidal neovascularization (HCC) OS, now 3 months post injection intravitreal Avastin, much less retinal thickening, subretinal scarring accounts for acuity.  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 Exudative age-related macular degeneration of left eye with active choroidal neovascularization (HCC)  H35.3221 OCT, Retina - OU - Both Eyes    Intravitreal Injection, Pharmacologic Agent - OS - Left Eye    bevacizumab (AVASTIN) SOSY 2.5 mg  2. 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  OS, with chronically active subfoveal disciform scar, stabilized however at 7-month follow-up and slightly less thickening today we will repeat injection today for maintenance and prevention of scotoma enlargement  2.  No therapy required right eye  3.  Ophthalmic Meds Ordered this visit:  Meds ordered this encounter  Medications  . bevacizumab (AVASTIN) SOSY 2.5 mg        Return in about 3 months (around 09/23/2020) for DILATE OU, AVASTIN OCT, OS.  There are no Patient Instructions on file for this visit.   Explained the diagnoses, plan, and follow up with the patient and they expressed understanding.  Patient expressed understanding of the importance of proper follow up care.   Alford Highland Celestine Bougie M.D. Diseases & Surgery of the Retina and Vitreous [facility] 06/26/20     Abbreviations: M myopia (nearsighted); A astigmatism; H hyperopia (farsighted); P presbyopia; Mrx spectacle prescription;  CTL contact lenses; OD right eye; OS left eye; OU both eyes  XT exotropia; ET esotropia; PEK punctate epithelial keratitis; PEE punctate epithelial erosions; DES dry eye syndrome; MGD meibomian gland dysfunction; ATs artificial tears; PFAT's preservative free artificial tears; NSC nuclear sclerotic cataract; PSC posterior subcapsular cataract; ERM epi-retinal membrane; PVD posterior vitreous detachment; RD retinal detachment; DM diabetes mellitus; DR diabetic retinopathy; NPDR non-proliferative diabetic retinopathy; PDR proliferative diabetic retinopathy; CSME clinically significant macular edema; DME diabetic macular edema; dbh dot blot hemorrhages; CWS cotton wool spot; POAG primary open angle glaucoma; C/D cup-to-disc ratio; HVF humphrey visual field; GVF goldmann visual field; OCT optical coherence tomography; IOP intraocular pressure; BRVO Branch retinal vein occlusion; CRVO central retinal vein occlusion; CRAO central retinal artery occlusion; BRAO branch retinal artery occlusion; RT retinal tear; SB scleral buckle; PPV pars plana vitrectomy; VH Vitreous hemorrhage; PRP panretinal laser photocoagulation; IVK intravitreal kenalog; VMT vitreomacular traction; MH Macular hole;  NVD neovascularization of the disc; NVE neovascularization elsewhere; AREDS age related eye disease study; ARMD age related macular degeneration; POAG primary open angle glaucoma;  EBMD epithelial/anterior basement membrane dystrophy; ACIOL anterior chamber intraocular lens; IOL intraocular lens; PCIOL posterior chamber intraocular lens; Phaco/IOL phacoemulsification with intraocular lens placement; PRK photorefractive keratectomy; LASIK laser assisted in situ keratomileusis; HTN hypertension; DM diabetes mellitus; COPD chronic obstructive pulmonary disease

## 2020-06-26 NOTE — Assessment & Plan Note (Signed)
OS, now 3 months post injection intravitreal Avastin, much less retinal thickening, subretinal scarring accounts for acuity.  With clinically SR heme present in the macular region will repeat injection Avastin today to maintain and prevent scotoma enlargement

## 2020-09-23 ENCOUNTER — Encounter (INDEPENDENT_AMBULATORY_CARE_PROVIDER_SITE_OTHER): Payer: Medicare Other | Admitting: Ophthalmology

## 2020-10-01 ENCOUNTER — Encounter (INDEPENDENT_AMBULATORY_CARE_PROVIDER_SITE_OTHER): Payer: Self-pay | Admitting: Ophthalmology

## 2020-10-01 ENCOUNTER — Other Ambulatory Visit: Payer: Self-pay

## 2020-10-01 ENCOUNTER — Ambulatory Visit (INDEPENDENT_AMBULATORY_CARE_PROVIDER_SITE_OTHER): Payer: Medicare Other | Admitting: Ophthalmology

## 2020-10-01 DIAGNOSIS — H353221 Exudative age-related macular degeneration, left eye, with active choroidal neovascularization: Secondary | ICD-10-CM | POA: Diagnosis not present

## 2020-10-01 DIAGNOSIS — H353124 Nonexudative age-related macular degeneration, left eye, advanced atrophic with subfoveal involvement: Secondary | ICD-10-CM

## 2020-10-01 DIAGNOSIS — H353212 Exudative age-related macular degeneration, right eye, with inactive choroidal neovascularization: Secondary | ICD-10-CM | POA: Diagnosis not present

## 2020-10-01 MED ORDER — BEVACIZUMAB 2.5 MG/0.1ML IZ SOSY
2.5000 mg | PREFILLED_SYRINGE | INTRAVITREAL | Status: AC | PRN
  Administered 2020-10-01: 2.5 mg via INTRAVITREAL

## 2020-10-01 NOTE — Assessment & Plan Note (Signed)
Now under control again with no subretinal fluid or hemorrhage seen clinically or on OCT.  At 65-month follow-up post Avastin.  Repeat injection today and maintain 59-month quarterly visit

## 2020-10-01 NOTE — Progress Notes (Signed)
10/01/2020     CHIEF COMPLAINT Patient presents for Retina Follow Up (3 month fu OU and Avastin OS/Pt states VA OU stable since last visit. Pt denies FOL, floaters, or ocular pain OU. /Pt reports using Timolol QDAY OS/)   HISTORY OF PRESENT ILLNESS: Juan Reed is a 75 y.o. male who presents to the clinic today for:   HPI    Retina Follow Up    Diagnosis: Wet AMD   Laterality: left eye   Onset: 3 months ago   Severity: mild   Duration: 3 weeks   Course: stable   Comments: 3 month fu OU and Avastin OS Pt states VA OU stable since last visit. Pt denies FOL, floaters, or ocular pain OU.  Pt reports using Timolol QDAY OS        Last edited by Demetrios Loll, COA on 10/01/2020 10:40 AM. (History)      Referring physician: [name], MD [address] [place],  [place] [address]  HISTORICAL INFORMATION:   Selected notes from the MEDICAL RECORD NUMBER       CURRENT MEDICATIONS: Current Outpatient Medications (Ophthalmic Drugs)  Medication Sig  . ranibizumab (LUCENTIS) 0.5 MG/0.05ML SOLN 0.5 mg by Intravitreal route every 3 (three) months. Alternates eyes between  . timolol (TIMOPTIC) 0.5 % ophthalmic solution INSTILL 1 DROP IN LEFT EYE DAILY AS DIRECTED   No current facility-administered medications for this visit. (Ophthalmic Drugs)   Current Outpatient Medications (Other)  Medication Sig  . aspirin EC 325 MG EC tablet Take 1 tablet (325 mg total) by mouth 2 (two) times daily after a meal.  . losartan-hydrochlorothiazide (HYZAAR) 100-25 MG per tablet Take 1 tablet by mouth every morning.  . Multiple Vitamins-Minerals (CENTRUM SILVER ULTRA MENS PO) Take 1 tablet by mouth.  Marland Kitchen OVER THE COUNTER MEDICATION Lutein 25mg   With Zeaxanthin  TAB dAILY  . oxyCODONE-acetaminophen (ROXICET) 5-325 MG per tablet Take 1-2 tablets by mouth every 4 (four) hours as needed for severe pain.  . predniSONE (DELTASONE) 50 MG tablet Take one 50 mg tablet once daily for the next five  days.   No current facility-administered medications for this visit. (Other)      REVIEW OF SYSTEMS:    ALLERGIES No Known Allergies  PAST MEDICAL HISTORY Past Medical History:  Diagnosis Date  . Arthritis   . COPD (chronic obstructive pulmonary disease) (HCC) 6/15   per chest x ray  . Hyperlipidemia, mixed   . Hypertension   . Macular degeneration of both eyes 2012   receiving injections   Past Surgical History:  Procedure Laterality Date  . COLONOSCOPY     x 3  . NASAL POLYP SURGERY    . 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 No pertinent family history.  SOCIAL HISTORY Social History   Tobacco Use  . Smoking status: Former Smoker    Packs/day: 1.00    Years: 10.00    Pack years: 10.00    Quit date: 10/23/1979    Years since quitting: 40.9  . Smokeless tobacco: Never Used  Substance Use Topics  . Alcohol use: Yes    Comment: "750 ml week(some weeks) liquor"  . Drug use: No         OPHTHALMIC EXAM: Base Eye Exam    Visual Acuity (  ETDRS)      Right Left   Dist cc 20/50 -2 CF at face   Dist ph cc NI    Correction: Glasses       Tonometry (Tonopen, 10:44 AM)      Right Left   Pressure 16 17       Pupils      Pupils Dark Light Shape React APD   Right PERRL 3 2 Round Brisk None   Left PERRL 3 2 Round Brisk None       Visual Fields (Counting fingers)      Left Right    Full Full       Extraocular Movement      Right Left    Full Full       Neuro/Psych    Oriented x3: Yes   Mood/Affect: Normal       Dilation    Left eye: 1.0% Mydriacyl, 2.5% Phenylephrine @ 10:44 AM        Slit Lamp and Fundus Exam    External Exam      Right Left   External Normal Normal       Slit Lamp Exam      Right Left   Lids/Lashes Normal Normal    Conjunctiva/Sclera White and quiet White and quiet   Cornea Clear Clear   Anterior Chamber Deep and quiet Deep and quiet   Iris Round and reactive Round and reactive   Lens Centered posterior chamber intraocular lens, PC clear Centered posterior chamber intraocular lens, PC clear   Anterior Vitreous Normal Normal       Fundus Exam      Right Left   Posterior Vitreous  Posterior vitreous detachment, Central vitreous floaters   Disc  Normal   C/D Ratio  0.7   Macula  Small subretinal  Hemorrhage resolved, Disciform scar, Retinal pigment epithelial detachment, Macular thickening, Cystoid macular edema, Advanced age related macular degeneration, Geographic atrophy   Vessels  Normal   Periphery  Normal          IMAGING AND PROCEDURES  Imaging and Procedures for 10/01/20  OCT, Retina - OU - Both Eyes       Right Eye Quality was good. Scan locations included subfoveal. Central Foveal Thickness: 232. Progression has been stable. Findings include abnormal foveal contour, disciform scar.   Left Eye Quality was good. Scan locations included subfoveal. Central Foveal Thickness: 290. Progression has been stable. Findings include disciform scar, no IRF, no SRF, subretinal scarring.   Notes Old disciform scar, RPE rip not in the fovea right eye, remained stable will observe no signs of active disease OD   OS, now 3 months post injection intravitreal Avastin, much less retinal thickening, subretinal scarring accounts for acuity.  No residual subretinal fluid.       Intravitreal Injection, Pharmacologic Agent - OS - Left Eye       Time Out 10/01/2020. 11:42 AM. Confirmed correct patient, procedure, site, and patient consented.   Anesthesia Topical anesthesia was used. Anesthetic medications included Akten 3.5%.   Procedure Preparation included Tobramycin 0.3%, 10% betadine to eyelids, 5% betadine to ocular surface. A 30 gauge needle was used.   Injection:  2.5 mg Bevacizumab  (AVASTIN) 2.5mg /0.90mL SOSY   NDC: 37048-889-16, Lot: [id number]   Route: Intravitreal, Site: Left Eye  Post-op Post injection exam found visual acuity of at least counting fingers. The patient tolerated the procedure well. There were no complications. The patient received written and verbal post  procedure care education. Post injection medications were not given.                 ASSESSMENT/PLAN:  Advanced nonexudative age-related macular degeneration of left eye with subfoveal involvement Atrophy in conjunction with disciform scarring subfoveal accounts for acuity and slight acuity progressive loss left eye  Exudative age-related macular degeneration of left eye with active choroidal neovascularization (HCC) Now under control again with no subretinal fluid or hemorrhage seen clinically or on OCT.  At 34-month follow-up post Avastin.  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      ICD-10-CM   1. Exudative age-related macular degeneration of left eye with active choroidal neovascularization (HCC)  H35.3221 OCT, Retina - OU - Both Eyes    Intravitreal Injection, Pharmacologic Agent - OS - Left Eye    bevacizumab (AVASTIN) SOSY 2.5 mg  2. Advanced nonexudative age-related macular degeneration of left eye with subfoveal involvement  H35.3124   3. 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  History of bilateral wet AMD.  OD has been quiescent now for some years  OS had recent recurrence with subretinal hemorrhage along with edge activity of a subfoveal disciform scar.  Disciform scar limits acuity and likely leads to progressive atrophy and thus patient's perceived vision changes over time.  On 43-month follow-up, much less clinical SR fluid as  well as SR heme.  Repeat injection today Avastin OS  2.  Dilate OU next possible Avastin OS next  3.  Ophthalmic Meds Ordered this visit:  Meds ordered this encounter  Medications  . bevacizumab (AVASTIN) SOSY 2.5 mg       Return in about 3 months (around 01/01/2021) for DILATE OU, AVASTIN OCT, OS.  There are no Patient Instructions on file for this visit.   Explained the diagnoses, plan, and follow up with the patient and they expressed understanding.  Patient expressed understanding of the importance of proper follow up care.   Alford Highland Alveda Vanhorne M.D. Diseases & Surgery of the Retina and Vitreous [facility] 10/01/20     Abbreviations: M myopia (nearsighted); A astigmatism; H hyperopia (farsighted); P presbyopia; Mrx spectacle prescription;  CTL contact lenses; OD right eye; OS left eye; OU both eyes  XT exotropia; ET esotropia; PEK punctate epithelial keratitis; PEE punctate epithelial erosions; DES dry eye syndrome; MGD meibomian gland dysfunction; ATs artificial tears; PFAT's preservative free artificial tears; NSC nuclear sclerotic cataract; PSC posterior subcapsular cataract; ERM epi-retinal membrane; PVD posterior vitreous detachment; RD retinal detachment; DM diabetes mellitus; DR diabetic retinopathy; NPDR non-proliferative diabetic retinopathy; PDR proliferative diabetic retinopathy; CSME clinically significant macular edema; DME diabetic macular edema; dbh dot blot hemorrhages; CWS cotton wool spot; POAG primary open angle glaucoma; C/D cup-to-disc ratio; HVF humphrey visual field; GVF goldmann visual field; OCT optical coherence tomography; IOP intraocular pressure; BRVO Branch retinal vein occlusion; CRVO central retinal vein occlusion; CRAO central retinal artery occlusion; BRAO branch retinal artery occlusion; RT retinal tear; SB scleral buckle; PPV pars plana vitrectomy; VH Vitreous hemorrhage; PRP panretinal laser photocoagulation; IVK intravitreal kenalog;  VMT vitreomacular traction; MH Macular hole;  NVD neovascularization of the disc; NVE neovascularization elsewhere; AREDS age related eye disease study; ARMD age related macular degeneration; POAG primary open angle glaucoma; EBMD epithelial/anterior basement membrane dystrophy; ACIOL anterior  chamber intraocular lens; IOL intraocular lens; PCIOL posterior chamber intraocular lens; Phaco/IOL phacoemulsification with intraocular lens placement; [place] photorefractive keratectomy; LASIK laser assisted in situ keratomileusis; HTN hypertension; DM diabetes mellitus; COPD chronic obstructive pulmonary disease

## 2020-10-01 NOTE — Assessment & Plan Note (Signed)
No signs of active disease in the right eye.

## 2020-10-01 NOTE — Assessment & Plan Note (Signed)
Atrophy in conjunction with disciform scarring subfoveal accounts for acuity and slight acuity progressive loss left eye

## 2020-10-15 ENCOUNTER — Other Ambulatory Visit (INDEPENDENT_AMBULATORY_CARE_PROVIDER_SITE_OTHER): Payer: Self-pay | Admitting: Ophthalmology

## 2020-12-31 ENCOUNTER — Ambulatory Visit (INDEPENDENT_AMBULATORY_CARE_PROVIDER_SITE_OTHER): Payer: Medicare Other | Admitting: Ophthalmology

## 2020-12-31 ENCOUNTER — Other Ambulatory Visit: Payer: Self-pay

## 2020-12-31 ENCOUNTER — Encounter (INDEPENDENT_AMBULATORY_CARE_PROVIDER_SITE_OTHER): Payer: Self-pay | Admitting: Ophthalmology

## 2020-12-31 DIAGNOSIS — H353221 Exudative age-related macular degeneration, left eye, with active choroidal neovascularization: Secondary | ICD-10-CM | POA: Diagnosis not present

## 2020-12-31 DIAGNOSIS — H353212 Exudative age-related macular degeneration, right eye, with inactive choroidal neovascularization: Secondary | ICD-10-CM

## 2020-12-31 DIAGNOSIS — H353124 Nonexudative age-related macular degeneration, left eye, advanced atrophic with subfoveal involvement: Secondary | ICD-10-CM

## 2020-12-31 MED ORDER — BEVACIZUMAB 2.5 MG/0.1ML IZ SOSY
2.5000 mg | PREFILLED_SYRINGE | INTRAVITREAL | Status: AC | PRN
  Administered 2020-12-31: 2.5 mg via INTRAVITREAL

## 2020-12-31 NOTE — Assessment & Plan Note (Signed)
Subfoveal stable yet edge of lesion with clinical subretinal hemorrhage at 38-month follow-up thus will need repeat injection Avastin today

## 2020-12-31 NOTE — Assessment & Plan Note (Signed)
Impact on acuity otherwise stable

## 2020-12-31 NOTE — Assessment & Plan Note (Signed)
No sign of recurrence OD will observe

## 2020-12-31 NOTE — Progress Notes (Signed)
12/31/2020     CHIEF COMPLAINT Patient presents for  Chief Complaint  Patient presents with   Retina Follow Up      HISTORY OF PRESENT ILLNESS: Juan Reed is a 75 y.o. male who presents to the clinic today for:   HPI     Retina Follow Up           Diagnosis: Wet AMD   Laterality: left eye   Onset: 13 weeks ago   Severity: mild   Duration: 13 weeks         Comments   3 month f/u OU with OCT, Avastin OS  Pt states vision remains unchanged and stable from previous visit. Pt denies any new floaters or flashes of light since previous visit. Pt denies any pain in or around the eye.   Eye Meds: Timolol QAM OS      Last edited by Frederik Pear, COA on 12/31/2020 11:25 AM.      Referring physician: [name], MD [address] [place],  [place] [address]  HISTORICAL INFORMATION:   Selected notes from the MEDICAL RECORD NUMBER       CURRENT MEDICATIONS: Current Outpatient Medications (Ophthalmic Drugs)  Medication Sig   ranibizumab (LUCENTIS) 0.5 MG/0.05ML SOLN 0.5 mg by Intravitreal route every 3 (three) months. Alternates eyes between   timolol (TIMOPTIC) 0.5 % ophthalmic solution INSTILL 1 DROP IN LEFT EYE DAILY AS DIRECTED   No current facility-administered medications for this visit. (Ophthalmic Drugs)   Current Outpatient Medications (Other)  Medication Sig   aspirin EC 325 MG EC tablet Take 1 tablet (325 mg total) by mouth 2 (two) times daily after a meal.   losartan-hydrochlorothiazide (HYZAAR) 100-25 MG per tablet Take 1 tablet by mouth every morning.   Multiple Vitamins-Minerals (CENTRUM SILVER ULTRA MENS PO) Take 1 tablet by mouth.   OVER THE COUNTER MEDICATION Lutein 25mg   With Zeaxanthin  TAB dAILY   oxyCODONE-acetaminophen (ROXICET) 5-325 MG per tablet Take 1-2 tablets by mouth every 4 (four) hours as needed for severe pain.   predniSONE (DELTASONE) 50 MG tablet Take one 50 mg tablet once daily for the next five days.   No current  facility-administered medications for this visit. (Other)      REVIEW OF SYSTEMS:    ALLERGIES No Known Allergies  PAST MEDICAL HISTORY Past Medical History:  Diagnosis Date   Arthritis    COPD (chronic obstructive pulmonary disease) (HCC) 6/15   per chest x ray   Hyperlipidemia, mixed    Hypertension    Macular degeneration of both eyes 2012   receiving injections   Past Surgical History:  Procedure Laterality Date   COLONOSCOPY     x 3   NASAL POLYP SURGERY     TOTAL HIP ARTHROPLASTY Left 11/02/2013   Procedure: LEFT TOTAL HIP ARTHROPLASTY ANTERIOR APPROACH;  Surgeon: 11/04/2013, MD;  Location: [facility];  Service: Orthopedics;  Laterality: Left;    FAMILY HISTORY History reviewed. No pertinent family history.  SOCIAL HISTORY Social History   Tobacco Use   Smoking status: Former    Packs/day: 1.00    Years: 10.00    Pack years: 10.00    Types: Cigarettes    Quit date: 10/23/1979    Years since quitting: 41.2   Smokeless tobacco: Never  Substance Use Topics   Alcohol use: Yes    Comment: "750 ml week(some weeks) liquor"   Drug use: No  OPHTHALMIC EXAM:  Base Eye Exam     Visual Acuity (ETDRS)       Right Left   Dist cc 20/50 +1 CF@3ft    Dist ph cc NI NI    Correction: Glasses         Tonometry (Tonopen, 11:33 AM)       Right Left   Pressure 11 14         Pupils       Pupils Dark Light Shape React APD   Right PERRL 3 2 Round Brisk None   Left PERRL 3 2 Round Brisk None         Visual Fields (Counting fingers)       Left Right    Full Full  Pt noted a blurred/missing area superior-nasal in the left eye while check CVF with finger counting.        Extraocular Movement       Right Left    Full, Ortho Full, Ortho         Neuro/Psych     Oriented x3: Yes   Mood/Affect: Normal         Dilation     Both eyes: 1.0% Mydriacyl, 2.5% Phenylephrine @ 11:33 AM           Slit Lamp and Fundus Exam      External Exam       Right Left   External Normal Normal         Slit Lamp Exam       Right Left   Lids/Lashes Normal Normal   Conjunctiva/Sclera White and quiet White and quiet   Cornea Clear Clear   Anterior Chamber Deep and quiet Deep and quiet   Iris Round and reactive Round and reactive   Lens Centered posterior chamber intraocular lens, PC clear Centered posterior chamber intraocular lens, PC clear   Anterior Vitreous Normal Normal         Fundus Exam       Right Left   Posterior Vitreous Posterior vitreous detachment Posterior vitreous detachment, Central vitreous floaters   Disc Normal Normal   C/D Ratio 0.45 0.7   Macula Geographic atrophy not in faz, no macular thickening, no hemorrhage, no exudates Small subretinal  Hemorrhage resolved, Disciform scar, Retinal pigment epithelial detachment, Macular thickening, Cystoid macular edema, Advanced age related macular degeneration, Geographic atrophy   Vessels Normal Normal   Periphery Normal Normal            IMAGING AND PROCEDURES  Imaging and Procedures for 12/31/20  OCT, Retina - OU - Both Eyes       Right Eye Quality was good. Scan locations included subfoveal. Central Foveal Thickness: 233. Progression has been stable. Findings include abnormal foveal contour, disciform scar.   Left Eye Quality was borderline. Scan locations included subfoveal. Central Foveal Thickness: 290. Progression has been stable. Findings include disciform scar, no IRF, no SRF, subretinal scarring.   Notes Old disciform scar, RPE rip not in the fovea right eye, remained stable will observe no signs of active disease OD   OS, now 3 months post injection intravitreal Avastin, much less retinal thickening, subretinal scarring accounts for acuity.  No residual subretinal fluid.     Intravitreal Injection, Pharmacologic Agent - OS - Left Eye       Time Out 12/31/2020. 11:50 AM. Confirmed correct patient, procedure, site,  and patient consented.   Anesthesia Topical anesthesia was used. Anesthetic medications included Akten 3.5%.   Procedure  Preparation included Tobramycin 0.3%, 10% betadine to eyelids, 5% betadine to ocular surface. A 30 gauge needle was used.   Injection: 2.5 mg bevacizumab 2.5 MG/0.1ML   Route: Intravitreal, Site: Left Eye   NDC: 2100612478, Lot: [id number]   Post-op Post injection exam found visual acuity of at least counting fingers. The patient tolerated the procedure well. There were no complications. The patient received written and verbal post procedure care education. Post injection medications were not given.              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 Exudative age-related macular degeneration of left eye with active choroidal neovascularization (HCC)  H35.3221 OCT, Retina - OU - Both Eyes    Intravitreal Injection, Pharmacologic Agent - OS - Left Eye    bevacizumab (AVASTIN) SOSY 2.5 mg    2. Advanced nonexudative age-related macular degeneration of left eye with subfoveal involvement  H35.3124     3. 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  OD remained stable with prior history of experienced active CNVM with now with residual disciform scar previous RPE rip no signs of recurrent CNVM off therapy for some years  2.  OS with subfoveal disciform limiting acuity yet  with edge lesion of activity we will repeat injection today to prevent scotoma enlargement OS follow-up in 3 months  3.  Ophthalmic Meds Ordered this visit:  Meds ordered this encounter  Medications   bevacizumab (AVASTIN) SOSY 2.5 mg       Return in about 3 months (around 04/02/2021) for DILATE OU, AVASTIN OCT, OS.  There are no Patient Instructions on file for this visit.   Explained the diagnoses, plan, and follow up with the patient and they expressed understanding.  Patient expressed understanding of the importance of proper follow up care.   Alford Highland Priest Lockridge M.D. Diseases & Surgery of the Retina and Vitreous [facility] 12/31/20     Abbreviations: M myopia (nearsighted); A astigmatism; H hyperopia (farsighted); P presbyopia; Mrx spectacle prescription;  CTL contact lenses; OD right eye; OS left eye; OU both eyes  XT exotropia; ET esotropia; PEK punctate epithelial keratitis; PEE punctate epithelial erosions; DES dry eye syndrome; MGD meibomian gland dysfunction; ATs artificial tears; PFAT's preservative free artificial tears; NSC nuclear sclerotic cataract; PSC posterior subcapsular cataract; ERM epi-retinal membrane; PVD posterior vitreous detachment; RD retinal detachment; DM diabetes mellitus; DR diabetic retinopathy; NPDR non-proliferative diabetic retinopathy; PDR proliferative diabetic retinopathy; CSME clinically significant macular edema; DME diabetic macular edema; dbh dot blot hemorrhages; CWS cotton wool spot; POAG primary open angle glaucoma; C/D cup-to-disc ratio; HVF humphrey visual field; GVF goldmann visual field; OCT optical coherence tomography; IOP intraocular pressure; BRVO Branch retinal vein occlusion; CRVO central retinal vein occlusion; CRAO central retinal artery occlusion; BRAO branch retinal artery occlusion; RT retinal tear; SB scleral buckle; PPV pars plana vitrectomy; VH Vitreous hemorrhage; PRP panretinal laser photocoagulation; IVK  intravitreal kenalog; VMT vitreomacular traction; MH Macular hole;  NVD neovascularization of the  disc; NVE neovascularization elsewhere; AREDS age related eye disease study; ARMD age related macular degeneration; POAG primary open angle glaucoma; EBMD epithelial/anterior basement membrane dystrophy; ACIOL anterior chamber intraocular lens; IOL intraocular lens; PCIOL posterior chamber intraocular lens; Phaco/IOL phacoemulsification with intraocular lens placement; [place] photorefractive keratectomy; LASIK laser assisted in situ keratomileusis; HTN hypertension; DM diabetes mellitus; COPD chronic obstructive pulmonary disease

## 2021-04-06 ENCOUNTER — Encounter (INDEPENDENT_AMBULATORY_CARE_PROVIDER_SITE_OTHER): Payer: Self-pay | Admitting: Ophthalmology

## 2021-04-06 ENCOUNTER — Ambulatory Visit (INDEPENDENT_AMBULATORY_CARE_PROVIDER_SITE_OTHER): Payer: Medicare Other | Admitting: Ophthalmology

## 2021-04-06 ENCOUNTER — Encounter (INDEPENDENT_AMBULATORY_CARE_PROVIDER_SITE_OTHER): Payer: Medicare Other | Admitting: Ophthalmology

## 2021-04-06 ENCOUNTER — Other Ambulatory Visit: Payer: Self-pay

## 2021-04-06 DIAGNOSIS — H353113 Nonexudative age-related macular degeneration, right eye, advanced atrophic without subfoveal involvement: Secondary | ICD-10-CM | POA: Diagnosis not present

## 2021-04-06 DIAGNOSIS — H353221 Exudative age-related macular degeneration, left eye, with active choroidal neovascularization: Secondary | ICD-10-CM

## 2021-04-06 DIAGNOSIS — H353212 Exudative age-related macular degeneration, right eye, with inactive choroidal neovascularization: Secondary | ICD-10-CM

## 2021-04-06 DIAGNOSIS — H353124 Nonexudative age-related macular degeneration, left eye, advanced atrophic with subfoveal involvement: Secondary | ICD-10-CM | POA: Diagnosis not present

## 2021-04-06 MED ORDER — BEVACIZUMAB 2.5 MG/0.1ML IZ SOSY
2.5000 mg | PREFILLED_SYRINGE | INTRAVITREAL | Status: AC | PRN
Start: 2021-04-06 — End: 2021-04-06
  Administered 2021-04-06: 11:00:00 2.5 mg via INTRAVITREAL

## 2021-04-06 NOTE — Progress Notes (Signed)
04/06/2021     CHIEF COMPLAINT Patient presents for  Chief Complaint  Patient presents with   Retina Follow Up      HISTORY OF PRESENT ILLNESS: Juan Reed is a 75 y.o. male who presents to the clinic today for:   HPI     Retina Follow Up   Patient presents with  Wet AMD.  In both eyes.  This started 3 months ago.  Duration of 3 months.  Since onset it is gradually worsening.        Comments   3 month f/u wet ARMD , YET quiet OD, FOR 1.5 years,  OU with OCT and possible Avastin injection OS.  Pt c/o worsening vision in the right eye, progressively over the past year.  EyeMeds: Timolol QAM OS      Last edited by Edmon Crape, MD on 04/06/2021 11:04 AM.      Referring physician: [name], MD [address] [place],  [place] [address]  HISTORICAL INFORMATION:   Selected notes from the MEDICAL RECORD NUMBER       CURRENT MEDICATIONS: Current Outpatient Medications (Ophthalmic Drugs)  Medication Sig   ranibizumab (LUCENTIS) 0.5 MG/0.05ML SOLN 0.5 mg by Intravitreal route every 3 (three) months. Alternates eyes between   timolol (TIMOPTIC) 0.5 % ophthalmic solution INSTILL 1 DROP IN LEFT EYE DAILY AS DIRECTED   No current facility-administered medications for this visit. (Ophthalmic Drugs)   Current Outpatient Medications (Other)  Medication Sig   aspirin EC 325 MG EC tablet Take 1 tablet (325 mg total) by mouth 2 (two) times daily after a meal.   losartan-hydrochlorothiazide (HYZAAR) 100-25 MG per tablet Take 1 tablet by mouth every morning.   Multiple Vitamins-Minerals (CENTRUM SILVER ULTRA MENS PO) Take 1 tablet by mouth.   OVER THE COUNTER MEDICATION Lutein 25mg   With Zeaxanthin  TAB dAILY   oxyCODONE-acetaminophen (ROXICET) 5-325 MG per tablet Take 1-2 tablets by mouth every 4 (four) hours as needed for severe pain.   predniSONE (DELTASONE) 50 MG tablet Take one 50 mg tablet once daily for the next five days.   No current  facility-administered medications for this visit. (Other)      REVIEW OF SYSTEMS:    ALLERGIES No Known Allergies  PAST MEDICAL HISTORY Past Medical History:  Diagnosis Date   Arthritis    COPD (chronic obstructive pulmonary disease) (HCC) 6/15   per chest x ray   Hyperlipidemia, mixed    Hypertension    Macular degeneration of both eyes 2012   receiving injections   Past Surgical History:  Procedure Laterality Date   COLONOSCOPY     x 3   NASAL POLYP SURGERY     TOTAL HIP ARTHROPLASTY Left 11/02/2013   Procedure: LEFT TOTAL HIP ARTHROPLASTY ANTERIOR APPROACH;  Surgeon: 11/04/2013, MD;  Location: [facility];  Service: Orthopedics;  Laterality: Left;    FAMILY HISTORY History reviewed. No pertinent family history.  SOCIAL HISTORY Social History   Tobacco Use   Smoking status: Former    Packs/day: 1.00    Years: 10.00    Pack years: 10.00    Types: Cigarettes    Quit date: 10/23/1979    Years since quitting: 41.4   Smokeless tobacco: Never  Substance Use Topics   Alcohol use: Yes    Comment: "750 ml week(some weeks) liquor"   Drug use: No         OPHTHALMIC EXAM:  Base Eye Exam  Visual Acuity (ETDRS)       Right Left   Dist cc 20/50 +2 CF@4ft    Dist ph cc 20/30 NI    Correction: Glasses         Tonometry (Tonopen, 10:33 AM)       Right Left   Pressure 14 15         Pupils       Pupils Dark Light Shape React APD   Right PERRL 3 2 Round Brisk None   Left PERRL 3 2 Round Brisk None         Visual Fields (Counting fingers)       Left Right    Full Full         Extraocular Movement       Right Left    Full, Ortho Full, Ortho         Neuro/Psych     Oriented x3: Yes   Mood/Affect: Normal         Dilation     Both eyes: 1.0% Mydriacyl, 2.5% Phenylephrine @ 10:32 AM           Slit Lamp and Fundus Exam     External Exam       Right Left   External Normal Normal         Slit Lamp Exam        Right Left   Lids/Lashes Normal Normal   Conjunctiva/Sclera White and quiet White and quiet   Cornea Clear Clear   Anterior Chamber Deep and quiet Deep and quiet   Iris Round and reactive Round and reactive   Lens Centered posterior chamber intraocular lens, PC clear Centered posterior chamber intraocular lens, PC clear   Anterior Vitreous Normal Normal         Fundus Exam       Right Left   Posterior Vitreous Posterior vitreous detachment Posterior vitreous detachment, Central vitreous floaters   Disc Normal Normal   C/D Ratio 0.45 0.7   Macula Geographic atrophy not in faz specifically located temporally, representing an area of RPE rip, no macular thickening, no hemorrhage, no exudates Small subretinal  Hemorrhage with dark reddish hue in the inferior aspect of disciform scar Disciform scar, Retinal pigment epithelial detachment, Macular thickening, Cystoid macular edema, Advanced age related macular degeneration, Geographic atrophy   Vessels Normal Normal   Periphery Normal Normal            IMAGING AND PROCEDURES  Imaging and Procedures for 04/06/21  OCT, Retina - OU - Both Eyes       Right Eye Quality was good. Scan locations included subfoveal. Central Foveal Thickness: 231. Progression has been stable. Findings include abnormal foveal contour, disciform scar.   Left Eye Quality was borderline. Scan locations included subfoveal. Central Foveal Thickness: 290. Progression has been stable. Findings include disciform scar, no IRF, no SRF, subretinal scarring.   Notes Old disciform scar, RPE rip not in the fovea right eye, remained stable will observe no signs of active disease OD   OS, now 3 months post injection intravitreal Avastin, much less retinal thickening, subretinal scarring accounts for acuity.  No residual subretinal fluid.  Yet clinically with small region of subretinal hemorrhage now dark reddish hue seen     Intravitreal Injection, Pharmacologic  Agent - OS - Left Eye       Time Out 04/06/2021. 11:07 AM. Confirmed correct patient, procedure, site, and patient consented.   Anesthesia Topical anesthesia was used. Anesthetic  medications included Lidocaine 4%.   Procedure Preparation included Tobramycin 0.3%, 10% betadine to eyelids, 5% betadine to ocular surface. A 30 gauge needle was used.   Injection: 2.5 mg bevacizumab 2.5 MG/0.1ML   Route: Intravitreal, Site: Left Eye   NDC: [phone], Lot: [id number]   Post-op Post injection exam found visual acuity of at least counting fingers. The patient tolerated the procedure well. There were no complications. The patient received written and verbal post procedure care education. Post injection medications included ocuflox.              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 Exudative age-related macular degeneration of left eye with active choroidal neovascularization (HCC)  H35.3221 OCT, Retina - OU - Both Eyes    Intravitreal Injection, Pharmacologic Agent - OS - Left Eye    bevacizumab (AVASTIN) SOSY 2.5 mg    2. 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 Advanced  nonexudative age-related macular degeneration of left eye with subfoveal involvement  H35.3124     4. 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  OD no sign of recurrence of CNVM now 1.5 years post most recent therapy will continue to observe.  Acuity preserved  2.  OS subfoveal disciform limits acuity, yet edges of lesion had some activity in the past now controlled at 56-month interval.  Repeat injection today follow-up again in 3 months OU  3.  Ophthalmic Meds Ordered this visit:  Meds ordered this encounter  Medications   bevacizumab (AVASTIN) SOSY 2.5 mg       Return in about 3 months (around 07/07/2021) for DILATE OU, AVASTIN OCT, OS.  There are no Patient Instructions on file for this visit.   Explained the diagnoses, plan, and follow up with the patient and they expressed understanding.  Patient expressed understanding of the importance of proper follow up care.   Alford Highland Delara Shepheard M.D. Diseases & Surgery of the Retina and Vitreous [facility] 04/06/21     Abbreviations: M myopia (nearsighted); A astigmatism; H hyperopia (farsighted); P presbyopia; Mrx spectacle prescription;  CTL contact lenses; OD right eye; OS left eye; OU both eyes  XT exotropia; ET esotropia; PEK punctate epithelial keratitis; PEE punctate epithelial erosions; DES dry eye syndrome; MGD meibomian gland dysfunction; ATs artificial tears; PFAT's preservative free artificial tears; NSC nuclear sclerotic cataract; PSC posterior subcapsular cataract; ERM epi-retinal membrane; PVD posterior vitreous detachment; RD retinal detachment; DM diabetes mellitus; DR diabetic retinopathy; NPDR non-proliferative diabetic retinopathy; PDR proliferative diabetic retinopathy; CSME clinically significant macular edema; DME diabetic macular edema; dbh dot blot hemorrhages; CWS cotton wool spot; POAG primary open angle glaucoma; C/D  cup-to-disc ratio; HVF humphrey visual field; GVF goldmann visual field; OCT optical coherence tomography; IOP intraocular  pressure; BRVO Branch retinal vein occlusion; CRVO central retinal vein occlusion; CRAO central retinal artery occlusion; BRAO branch retinal artery occlusion; RT retinal tear; SB scleral buckle; PPV pars plana vitrectomy; VH Vitreous hemorrhage; PRP panretinal laser photocoagulation; IVK intravitreal kenalog; VMT vitreomacular traction; MH Macular hole;  NVD neovascularization of the disc; NVE neovascularization elsewhere; AREDS age related eye disease study; ARMD age related macular degeneration; POAG primary open angle glaucoma; EBMD epithelial/anterior basement membrane dystrophy; ACIOL anterior chamber intraocular lens; IOL intraocular lens; PCIOL posterior chamber intraocular lens; Phaco/IOL phacoemulsification with intraocular lens placement; [place] photorefractive keratectomy; LASIK laser assisted in situ keratomileusis; HTN hypertension; DM diabetes mellitus; COPD chronic obstructive pulmonary disease

## 2021-04-06 NOTE — Assessment & Plan Note (Signed)
Stable OS 

## 2021-04-06 NOTE — Assessment & Plan Note (Signed)
Last injection of antivegF delivered OD June 2021, no sign of recurrence

## 2021-04-06 NOTE — Assessment & Plan Note (Signed)
Chronic subfoveal disciform scar, with no new bleeding clinically detectable, small area of reddish hue at the inferior aspect of the lesion that is likely controlled at 66-month interval post injection Avastin repeat today to maintain

## 2021-04-06 NOTE — Assessment & Plan Note (Signed)
Stable OD °

## 2021-07-07 ENCOUNTER — Ambulatory Visit (INDEPENDENT_AMBULATORY_CARE_PROVIDER_SITE_OTHER): Payer: Medicare Other | Admitting: Ophthalmology

## 2021-07-07 ENCOUNTER — Other Ambulatory Visit: Payer: Self-pay

## 2021-07-07 DIAGNOSIS — H353113 Nonexudative age-related macular degeneration, right eye, advanced atrophic without subfoveal involvement: Secondary | ICD-10-CM

## 2021-07-07 DIAGNOSIS — H35723 Serous detachment of retinal pigment epithelium, bilateral: Secondary | ICD-10-CM

## 2021-07-07 DIAGNOSIS — H353212 Exudative age-related macular degeneration, right eye, with inactive choroidal neovascularization: Secondary | ICD-10-CM

## 2021-07-07 DIAGNOSIS — H353124 Nonexudative age-related macular degeneration, left eye, advanced atrophic with subfoveal involvement: Secondary | ICD-10-CM

## 2021-07-07 DIAGNOSIS — H353221 Exudative age-related macular degeneration, left eye, with active choroidal neovascularization: Secondary | ICD-10-CM | POA: Diagnosis not present

## 2021-07-07 MED ORDER — BEVACIZUMAB 2.5 MG/0.1ML IZ SOSY
2.5000 mg | PREFILLED_SYRINGE | INTRAVITREAL | Status: AC | PRN
  Administered 2021-07-07: 2.5 mg via INTRAVITREAL

## 2021-07-07 NOTE — Assessment & Plan Note (Signed)
HIV mostly resolved as compared to 2017, as component of wet AMD

## 2021-07-07 NOTE — Progress Notes (Signed)
07/07/2021     CHIEF COMPLAINT Patient presents for  Chief Complaint  Patient presents with   Macular Degeneration    With history of bilateral macular disease, now the right eye has been without specific antivegF therapy for 20 months,  OS today follow-up at 56-month post injection  HISTORY OF PRESENT ILLNESS: Juan Reed is a 76 y.o. 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  No interval change in vision, no medical history change last 62-month Last edited by Edmon Crape, MD on 07/07/2021 10:33 AM.      Referring physician: [name], MD [address] [place],  [place] [address]  HISTORICAL INFORMATION:   Selected notes from the MEDICAL RECORD NUMBER       CURRENT MEDICATIONS: Current Outpatient Medications (Ophthalmic Drugs)  Medication Sig   ranibizumab (LUCENTIS) 0.5 MG/0.05ML SOLN 0.5 mg by Intravitreal route every 3 (three) months. Alternates eyes between   timolol (TIMOPTIC) 0.5 % ophthalmic solution INSTILL 1 DROP IN LEFT EYE DAILY AS DIRECTED   No current facility-administered medications for this visit. (Ophthalmic Drugs)   Current Outpatient Medications (Other)  Medication Sig   aspirin EC 325 MG EC tablet Take 1 tablet (325 mg total) by mouth 2 (two) times daily after a meal.   losartan-hydrochlorothiazide (HYZAAR) 100-25 MG per tablet Take 1 tablet by mouth every morning.   Multiple Vitamins-Minerals (CENTRUM SILVER ULTRA MENS PO) Take 1 tablet by mouth.   OVER THE COUNTER MEDICATION Lutein 25mg   With Zeaxanthin  TAB dAILY   oxyCODONE-acetaminophen (ROXICET) 5-325 MG per tablet Take 1-2 tablets by mouth every 4 (four) hours as needed for severe pain.   predniSONE (DELTASONE) 50 MG tablet Take one 50 mg tablet once daily for the next  five days.   No current facility-administered medications for this visit. (Other)      REVIEW OF SYSTEMS: ROS   Negative for: Constitutional, Gastrointestinal, Neurological, Skin, Genitourinary, Musculoskeletal, HENT, Endocrine, Cardiovascular, Eyes, Respiratory, Psychiatric, Allergic/Imm, Heme/Lymph Last edited by , MD on 07/07/2021 10:31 AM.       ALLERGIES No Known Allergies  PAST MEDICAL HISTORY Past Medical History:  Diagnosis Date   Arthritis    COPD (chronic obstructive pulmonary disease) (HCC) 6/15   per chest x ray   Hyperlipidemia, mixed    Hypertension    Macular degeneration of both eyes 2012   receiving injections   Past Surgical History:  Procedure Laterality Date   COLONOSCOPY     x 3   NASAL POLYP SURGERY     TOTAL HIP ARTHROPLASTY Left 11/02/2013   Procedure: LEFT TOTAL HIP ARTHROPLASTY ANTERIOR APPROACH;  Surgeon: 11/04/2013, MD;  Location: [facility];  Service: Orthopedics;  Laterality: Left;    FAMILY HISTORY No family history on file.  SOCIAL HISTORY Social History   Tobacco Use   Smoking status: Former    Packs/day: 1.00    Years: 10.00    Pack years: 10.00    Types: Cigarettes    Quit date: 10/23/1979    Years since quitting: 41.7   Smokeless tobacco: Never  Substance Use Topics   Alcohol use: Yes    Comment: "750 ml week(some weeks) liquor"   Drug use: No  OPHTHALMIC EXAM:  Base Eye Exam     Visual Acuity (ETDRS)       Right Left   Dist cc 20/50 +2 CF at 6'    Correction: Glasses         Tonometry (Tonopen, 10:34 AM)       Right Left   Pressure 9 13         Pupils       Pupils APD   Right PERRL None   Left PERRL None         Visual Fields       Left Right     Full   Restrictions Partial inner superior temporal, inferior temporal, superior nasal, inferior nasal deficiencies          Dilation     Both eyes: 1.0% Mydriacyl, 2.5% Phenylephrine @ 10:34 AM             IMAGING AND PROCEDURES  Imaging and Procedures for 07/07/21  OCT, Retina - OU - Both Eyes       Right Eye Quality was good. Scan locations included subfoveal. Central Foveal Thickness: 232. Progression has been stable. Findings include abnormal foveal contour, disciform scar.   Left Eye Quality was borderline. Scan locations included subfoveal. Central Foveal Thickness: 280. Progression has been stable. Findings include disciform scar, no IRF, no SRF, subretinal scarring.   Notes Old disciform scar, RPE rip not in the fovea right eye, remained stable will observe no signs of active disease OD   OS, now 3 months post injection intravitreal Avastin, much less retinal thickening, subretinal scarring accounts for acuity.  No residual subretinal fluid.  Yet clinically with small region of subretinal hemorrhage now dark reddish hue seen, and now relatively flat lesion complex as compared to 2017.     Intravitreal Injection, Pharmacologic Agent - OS - Left Eye       Time Out 07/07/2021. 11:14 AM. Confirmed correct patient, procedure, site, and patient consented.   Anesthesia Topical anesthesia was used. Anesthetic medications included Lidocaine 4%.   Procedure Preparation included Tobramycin 0.3%, 10% betadine to eyelids, 5% betadine to ocular surface. A 30 gauge needle was used.   Injection: 2.5 mg bevacizumab 2.5 MG/0.1ML   Route: Intravitreal, Site: Left Eye   NDC: [phone], Lot: [id number]   Post-op Post injection exam found visual acuity of at least counting fingers. The patient tolerated the procedure well. There were no complications. The patient received written and verbal post procedure care education. Post injection medications included ocuflox.              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 Exudative age-related macular degeneration of left eye with active choroidal neovascularization (HCC)  H35.3221 OCT, Retina - OU - Both Eyes    Intravitreal Injection, Pharmacologic Agent - OS - Left Eye    bevacizumab (AVASTIN)  SOSY 2.5 mg    2. 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 Advanced nonexudative age-related macular degeneration of left eye with subfoveal involvement  H35.3124       1.  2.  3.  Ophthalmic Meds Ordered this visit:  Meds ordered this encounter  Medications   bevacizumab (AVASTIN) SOSY 2.5 mg       Return in about 15 weeks (around 10/20/2021) for DILATE OU, AVASTIN OCT, OS.  There are no Patient Instructions on file for this visit.   Explained the diagnoses, plan, and follow up with the patient and they expressed understanding.  Patient expressed understanding of the importance of proper follow up care.   Alford Highland Ellijah Leffel M.D. Diseases & Surgery of the Retina and Vitreous [facility] 07/07/21     Abbreviations: M myopia (nearsighted); A astigmatism; H hyperopia (farsighted); P presbyopia; Mrx spectacle  prescription;  CTL contact lenses; OD right eye; OS left eye; OU both eyes  XT exotropia; ET esotropia; PEK punctate epithelial keratitis; PEE punctate epithelial erosions; DES dry eye syndrome; MGD meibomian gland dysfunction; ATs artificial tears; PFAT's preservative free artificial tears; NSC nuclear sclerotic cataract; PSC posterior subcapsular cataract; ERM epi-retinal membrane; PVD posterior vitreous detachment; RD retinal detachment; DM diabetes mellitus; DR diabetic retinopathy; NPDR non-proliferative diabetic retinopathy; PDR proliferative diabetic retinopathy; CSME clinically significant macular edema; DME diabetic macular edema; dbh dot blot hemorrhages; CWS cotton wool spot; POAG primary open angle glaucoma; C/D cup-to-disc ratio; HVF humphrey visual field; GVF goldmann visual field; OCT optical coherence tomography; IOP intraocular pressure; BRVO Branch retinal vein occlusion; CRVO central retinal vein occlusion; CRAO central retinal artery occlusion; BRAO branch retinal artery occlusion; RT retinal tear; SB scleral buckle; PPV pars plana vitrectomy; VH Vitreous hemorrhage; PRP panretinal laser photocoagulation; IVK intravitreal kenalog; VMT vitreomacular traction; MH Macular hole;  NVD neovascularization of the disc; NVE neovascularization elsewhere; AREDS age related eye disease study; ARMD age related macular degeneration; POAG primary open angle glaucoma; EBMD epithelial/anterior basement membrane dystrophy; ACIOL anterior chamber intraocular lens; IOL intraocular lens; PCIOL posterior chamber intraocular lens; Phaco/IOL phacoemulsification with intraocular lens placement; PRK photorefractive keratectomy; LASIK laser assisted in situ keratomileusis; HTN hypertension; DM diabetes mellitus; COPD chronic obstructive pulmonary disease

## 2021-07-07 NOTE — Assessment & Plan Note (Signed)
Similar region of macular atrophy, temporal to the fovea from old RPE rip stable

## 2021-07-07 NOTE — Assessment & Plan Note (Signed)
Nearly flat lesion complex also surrounded by geographic atrophy, stable at 2-month interval.  We will repeat injection today and extend interval examination to 1516 weeks and consider observation next

## 2021-07-07 NOTE — Assessment & Plan Note (Signed)
Accounts for acuity 

## 2021-08-14 ENCOUNTER — Other Ambulatory Visit (INDEPENDENT_AMBULATORY_CARE_PROVIDER_SITE_OTHER): Payer: Self-pay | Admitting: Ophthalmology

## 2021-10-20 ENCOUNTER — Ambulatory Visit (INDEPENDENT_AMBULATORY_CARE_PROVIDER_SITE_OTHER): Payer: Medicare Other | Admitting: Ophthalmology

## 2021-10-20 DIAGNOSIS — H353212 Exudative age-related macular degeneration, right eye, with inactive choroidal neovascularization: Secondary | ICD-10-CM

## 2021-10-20 DIAGNOSIS — H353124 Nonexudative age-related macular degeneration, left eye, advanced atrophic with subfoveal involvement: Secondary | ICD-10-CM

## 2021-10-20 DIAGNOSIS — H353211 Exudative age-related macular degeneration, right eye, with active choroidal neovascularization: Secondary | ICD-10-CM | POA: Diagnosis not present

## 2021-10-20 DIAGNOSIS — H353221 Exudative age-related macular degeneration, left eye, with active choroidal neovascularization: Secondary | ICD-10-CM | POA: Diagnosis not present

## 2021-10-20 MED ORDER — BEVACIZUMAB 2.5 MG/0.1ML IZ SOSY
2.5000 mg | PREFILLED_SYRINGE | INTRAVITREAL | Status: AC | PRN
  Administered 2021-10-20: 2.5 mg via INTRAVITREAL

## 2021-10-20 NOTE — Progress Notes (Signed)
10/20/2021     CHIEF COMPLAINT Patient presents for  Chief Complaint  Patient presents with   Macular Degeneration      HISTORY OF PRESENT ILLNESS: Juan Reed is a 76 y.o. male who presents to the clinic today for:   HPI   15 weeks for DILATE OU, AVASTIN, OCT, OS. Pt confirms floaters and FOL. Pt reports, "My vision is more blurry in the right eye  with distortion and more flashes of light. The flashes of light occurred a week ago but then it stopped." Pt is currently taking TIMOLOL- 1drop into left eye daily  Last edited by Hurman Horn, MD on 10/20/2021 11:02 AM.      Referring physician: Lawerance Cruel, MD [place],  [place] [address]  HISTORICAL INFORMATION:   Selected notes from the MEDICAL RECORD NUMBER       CURRENT MEDICATIONS: Current Outpatient Medications (Ophthalmic Drugs)  Medication Sig   ranibizumab (LUCENTIS) 0.5 MG/0.05ML SOLN 0.5 mg by Intravitreal route every 3 (three) months. Alternates eyes between   timolol (TIMOPTIC) 0.5 % ophthalmic solution INSTILL 1 DROP IN LEFT EYE DAILY AS DIRECTED   No current facility-administered medications for this visit. (Ophthalmic Drugs)   Current Outpatient Medications (Other)  Medication Sig   aspirin EC 325 MG EC tablet Take 1 tablet (325 mg total) by mouth 2 (two) times daily after a meal.   losartan-hydrochlorothiazide (HYZAAR) 100-25 MG per tablet Take 1 tablet by mouth every morning.   Multiple Vitamins-Minerals (CENTRUM SILVER ULTRA MENS PO) Take 1 tablet by mouth.   OVER THE COUNTER MEDICATION Lutein 25mg   With Zeaxanthin  TAB dAILY   oxyCODONE-acetaminophen (ROXICET) 5-325 MG per tablet Take 1-2 tablets by mouth every 4 (four) hours as needed for severe pain.   predniSONE (DELTASONE) 50 MG tablet Take one 50 mg tablet once daily for the next five days.   No current facility-administered medications for this visit. (Other)      REVIEW OF SYSTEMS: ROS   Negative for:  Constitutional, Gastrointestinal, Neurological, Skin, Genitourinary, Musculoskeletal, HENT, Endocrine, Cardiovascular, Eyes, Respiratory, Psychiatric, Allergic/Imm, Heme/Lymph Last edited by Silvestre Moment on 10/20/2021 10:26 AM.       ALLERGIES No Known Allergies  PAST MEDICAL HISTORY Past Medical History:  Diagnosis Date   Arthritis    COPD (chronic obstructive pulmonary disease) ([place]) 6/15   per chest x ray   Hyperlipidemia, mixed    Hypertension    Macular degeneration of both eyes 2012   receiving injections   Past Surgical History:  Procedure Laterality Date   COLONOSCOPY     x 3   NASAL POLYP SURGERY     TOTAL HIP ARTHROPLASTY Left 11/02/2013   Procedure: LEFT TOTAL HIP ARTHROPLASTY ANTERIOR APPROACH;  Surgeon: Mcarthur Rossetti, MD;  Location: [facility];  Service: Orthopedics;  Laterality: Left;    FAMILY HISTORY No family history on file.  SOCIAL HISTORY Social History   Tobacco Use   Smoking status: Former    Packs/day: 1.00    Years: 10.00    Total pack years: 10.00    Types: Cigarettes    Quit date: 10/23/1979    Years since quitting: 42.0   Smokeless tobacco: Never  Substance Use Topics   Alcohol use: Yes    Comment: "750 ml week(some weeks) liquor"   Drug use: No         OPHTHALMIC EXAM:  Base Eye Exam     Visual Acuity (ETDRS)  Right Left   Dist cc 20/60 CF at 3'   Dist ph cc 20/50 -2     Correction: Glasses         Tonometry (Tonopen, 10:30 AM)       Right Left   Pressure 14 16         Pupils       Pupils APD   Right PERRL None   Left PERRL None         Visual Fields       Left Right     Full   Restrictions Partial inner superior temporal, inferior temporal, superior nasal, inferior nasal deficiencies          Extraocular Movement       Right Left    Full Full         Neuro/Psych     Oriented x3: Yes   Mood/Affect: Normal         Dilation     Both eyes: 1.0% Mydriacyl, 2.5% Phenylephrine @  10:30 AM           Slit Lamp and Fundus Exam     External Exam       Right Left   External Normal Normal         Slit Lamp Exam       Right Left   Lids/Lashes Normal Normal   Conjunctiva/Sclera White and quiet White and quiet   Cornea Clear Clear   Anterior Chamber Deep and quiet Deep and quiet   Iris Round and reactive Round and reactive   Lens Centered posterior chamber intraocular lens, PC clear Centered posterior chamber intraocular lens, PC clear   Anterior Vitreous Normal Normal         Fundus Exam       Right Left   Posterior Vitreous Posterior vitreous detachment Posterior vitreous detachment, Central vitreous floaters   Disc Normal Normal   C/D Ratio 0.45 0.7   Macula Geographic atrophy not in faz specifically located temporally, representing an area of RPE rip, no exudates, along ST arcade and temporal aspect to FAZ subretinal hemorrhage, Macular thickening Small subretinal  Hemorrhage with dark reddish hue in the inferior aspect of disciform scar Disciform scar, Retinal pigment epithelial detachment, Macular thickening, Cystoid macular edema, Advanced age related macular degeneration, Geographic atrophy   Vessels Normal Normal   Periphery Normal Normal            IMAGING AND PROCEDURES  Imaging and Procedures for 10/20/21  OCT, Retina - OU - Both Eyes       Right Eye Quality was good. Scan locations included subfoveal. Central Foveal Thickness: 246. Progression has been stable. Findings include abnormal foveal contour, disciform scar.   Left Eye Quality was borderline. Scan locations included subfoveal. Central Foveal Thickness: 303. Progression has been stable. Findings include no IRF, no SRF, disciform scar, subretinal scarring.   Notes Old disciform scar, RPE rip not in the fovea right eye, no signs of disease activity temporal aspect of the macula as well as superior aspect of the macula   OS, now 3 months post injection intravitreal  Avastin, much less retinal thickening, subretinal scarring accounts for acuity.  No residual subretinal fluid.  Yet clinically with small region of subretinal hemorrhage now dark reddish hue seen, and now relatively flat lesion complex as compared to 2017.  Planned injection left eye today will convert treatment to right eye with a monocular eye involvement Failed CNVM  Intravitreal Injection, Pharmacologic Agent - OD - Right Eye       Time Out 10/20/2021. 11:06 AM. Confirmed correct patient, procedure, site, and patient consented.   Anesthesia Topical anesthesia was used. Anesthetic medications included Lidocaine 4%.   Procedure Preparation included 5% betadine to ocular surface, 10% betadine to eyelids. A 30 gauge needle was used.   Injection: 2.5 mg bevacizumab 2.5 MG/0.1ML   Route: Intravitreal, Site: Right Eye   NDC: [phone], Lot: [id number], Expiration date: 12/06/2021   Post-op Post injection exam found visual acuity of at least counting fingers. The patient tolerated the procedure well. There were no complications. The patient received written and verbal post procedure care education. Post injection medications were not given.              ASSESSMENT/PLAN:  Exudative age-related macular degeneration of right eye with active choroidal neovascularization ([place]) Monocular patient with new onset CNVM superior and temporal to [place].  With new onset visual symptoms.  We will commence with therapy probably OD today follow-up next in 5 weeks  Exudative age-related macular degeneration of left eye with active choroidal neovascularization ([place]) Left eye chronic active subfoveal disciform scar, goal in the left eye remains controlled with scotoma size and enlargement.  Injection today for maintenance not done due to new onset finding in the right eye withi retinal hemorrhage and wet AMD, will treat and treated in the right eye today with Avastin  Advanced nonexudative  age-related macular degeneration of left eye with subfoveal involvement Atrophy accounts for acuity     ICD-10-CM   1. Exudative age-related macular degeneration of right eye with active choroidal neovascularization (HCC)  H35.3211 Intravitreal Injection, Pharmacologic Agent - OD - Right Eye    bevacizumab (AVASTIN) SOSY 2.5 mg    2. Exudative age-related macular degeneration of left eye with active choroidal neovascularization (HCC)  H35.3221 OCT, Retina - OU - Both Eyes    CANCELED: Intravitreal Injection, Pharmacologic Agent - OS - Left Eye    3. Advanced nonexudative age-related macular degeneration of left eye with subfoveal involvement  H35.3124     4. Exudative age-related macular degeneration of right eye with inactive choroidal neovascularization ([place])  H35.3212       1.  Changed and consent signed initially the left eye for Avastin was changed with Technician and physician and patient to commence therapy in the right eye again for new onset recurrent wet AMD.  This is to protect monocular status in this patient and excellent acuity.  2.  OD with wet AMD with preserved acuity.  Commence with therapy today Avastin follow-up OD next in 5 weeks  3.  OS follow-up in 2 weeks likely injection Avastin with OCT OS, and to reassess the treatment effect 2 weeks after Avastin delivered OD today  Ophthalmic Meds Ordered this visit:  Meds ordered this encounter  Medications   bevacizumab (AVASTIN) SOSY 2.5 mg       Return in about 5 weeks (around 11/24/2021) for dilate, OD, AVASTIN OCT,, and 2 weeks dilate OS  Avastin OCT.  There are no Patient Instructions on file for this visit.   Explained the diagnoses, plan, and follow up with the patient and they expressed understanding.  Patient expressed understanding of the importance of proper follow up care.   Clent Demark Jersey Espinoza M.D. Diseases & Surgery of the Retina and Vitreous [facility] 10/20/21     Abbreviations: M myopia (nearsighted); A astigmatism; H hyperopia (farsighted); P presbyopia;  Mrx spectacle prescription;  CTL contact lenses; OD right eye; OS left eye; OU both eyes  XT exotropia; ET esotropia; PEK punctate epithelial keratitis; PEE punctate epithelial erosions; DES dry eye syndrome; MGD meibomian gland dysfunction; ATs artificial tears; PFAT's preservative free artificial tears; [place] nuclear sclerotic cataract; PSC posterior subcapsular cataract; ERM epi-retinal membrane; PVD posterior vitreous detachment; RD retinal detachment; DM diabetes mellitus; DR diabetic retinopathy; NPDR non-proliferative diabetic retinopathy; PDR proliferative diabetic retinopathy; CSME clinically significant macular edema; DME diabetic macular edema; dbh dot blot hemorrhages; CWS cotton wool spot; POAG primary open angle glaucoma; C/D cup-to-disc ratio; HVF humphrey visual field; GVF goldmann visual field; OCT optical coherence tomography; IOP intraocular pressure; BRVO Branch retinal vein occlusion; CRVO central retinal vein occlusion; CRAO central retinal artery occlusion; BRAO branch retinal artery occlusion; RT retinal tear; SB scleral buckle; PPV pars plana vitrectomy; VH Vitreous hemorrhage; PRP panretinal laser photocoagulation; IVK intravitreal kenalog; VMT vitreomacular traction; MH Macular hole;  NVD neovascularization of the disc; NVE neovascularization elsewhere; AREDS age related eye disease study; ARMD age related macular degeneration; POAG primary open angle glaucoma; EBMD epithelial/anterior basement membrane dystrophy; ACIOL anterior chamber intraocular lens; IOL intraocular lens; PCIOL posterior chamber intraocular lens; Phaco/IOL phacoemulsification with intraocular lens placement; [place] photorefractive keratectomy; LASIK laser assisted in situ keratomileusis; HTN hypertension; DM diabetes mellitus; COPD chronic obstructive pulmonary disease

## 2021-10-20 NOTE — Assessment & Plan Note (Signed)
Monocular patient with new onset CNVM superior and temporal to [place].  With new onset visual symptoms.  We will commence with therapy probably OD today follow-up next in 5 weeks

## 2021-10-20 NOTE — Assessment & Plan Note (Signed)
Atrophy accounts for acuity 

## 2021-10-20 NOTE — Assessment & Plan Note (Signed)
Left eye chronic active subfoveal disciform scar, goal in the left eye remains controlled with scotoma size and enlargement.  Injection today for maintenance not done due to new onset finding in the right eye withi retinal hemorrhage and wet AMD, will treat and treated in the right eye today with Avastin

## 2021-11-04 ENCOUNTER — Encounter (INDEPENDENT_AMBULATORY_CARE_PROVIDER_SITE_OTHER): Payer: Medicare Other | Admitting: Ophthalmology

## 2021-11-05 ENCOUNTER — Encounter (INDEPENDENT_AMBULATORY_CARE_PROVIDER_SITE_OTHER): Payer: Self-pay | Admitting: Ophthalmology

## 2021-11-05 ENCOUNTER — Ambulatory Visit (INDEPENDENT_AMBULATORY_CARE_PROVIDER_SITE_OTHER): Payer: Medicare Other | Admitting: Ophthalmology

## 2021-11-05 DIAGNOSIS — H353221 Exudative age-related macular degeneration, left eye, with active choroidal neovascularization: Secondary | ICD-10-CM | POA: Diagnosis not present

## 2021-11-05 DIAGNOSIS — H353211 Exudative age-related macular degeneration, right eye, with active choroidal neovascularization: Secondary | ICD-10-CM

## 2021-11-05 MED ORDER — BEVACIZUMAB 2.5 MG/0.1ML IZ SOSY
2.5000 mg | PREFILLED_SYRINGE | INTRAVITREAL | Status: AC | PRN
  Administered 2021-11-05: 2.5 mg via INTRAVITREAL

## 2021-11-05 NOTE — Progress Notes (Signed)
11/05/2021     CHIEF COMPLAINT Patient presents for  Chief Complaint  Patient presents with   Macular Degeneration      HISTORY OF PRESENT ILLNESS: Juan Reed is a 76 y.o. male who presents to the clinic today for:   HPI   2 weeks for DILATE OS, AVASTIN, OCT. Pt stated vision has been stable since last visit.  Last edited by Angeline Slim on 11/05/2021 12:56 PM.      Referring physician: [name], MD [address] [place],  [place] [address]  HISTORICAL INFORMATION:   Selected notes from the MEDICAL RECORD NUMBER       CURRENT MEDICATIONS: Current Outpatient Medications (Ophthalmic Drugs)  Medication Sig   ranibizumab (LUCENTIS) 0.5 MG/0.05ML SOLN 0.5 mg by Intravitreal route every 3 (three) months. Alternates eyes between   timolol (TIMOPTIC) 0.5 % ophthalmic solution INSTILL 1 DROP IN LEFT EYE DAILY AS DIRECTED   No current facility-administered medications for this visit. (Ophthalmic Drugs)   Current Outpatient Medications (Other)  Medication Sig   aspirin EC 325 MG EC tablet Take 1 tablet (325 mg total) by mouth 2 (two) times daily after a meal.   losartan-hydrochlorothiazide (HYZAAR) 100-25 MG per tablet Take 1 tablet by mouth every morning.   Multiple Vitamins-Minerals (CENTRUM SILVER ULTRA MENS PO) Take 1 tablet by mouth.   OVER THE COUNTER MEDICATION Lutein 25mg   With Zeaxanthin  TAB dAILY   oxyCODONE-acetaminophen (ROXICET) 5-325 MG per tablet Take 1-2 tablets by mouth every 4 (four) hours as needed for severe pain.   predniSONE (DELTASONE) 50 MG tablet Take one 50 mg tablet once daily for the next five days.   No current facility-administered medications for this visit. (Other)      REVIEW OF SYSTEMS: ROS   Negative for: Constitutional, Gastrointestinal, Neurological, Skin, Genitourinary, Musculoskeletal, HENT, Endocrine, Cardiovascular, Eyes, Respiratory, Psychiatric, Allergic/Imm, Heme/Lymph Last edited by on 11/05/2021 12:56 PM.        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 No pertinent family history.  SOCIAL HISTORY Social History   Tobacco Use   Smoking status: Former    Packs/day: 1.00    Years: 10.00    Total pack years: 10.00    Types: Cigarettes    Quit date: 10/23/1979    Years since quitting: 42.0   Smokeless tobacco: Never  Substance Use Topics   Alcohol use: Yes    Comment: "750 ml week(some weeks) liquor"   Drug use: No         OPHTHALMIC EXAM:  Base Eye Exam     Visual Acuity (ETDRS)  Right Left   Dist cc 20/50 -1 CF at 2'   Dist ph cc NI     Correction: Glasses         Tonometry (Tonopen, 1:01 PM)       Right Left   Pressure 17 18         Pupils       Pupils APD   Right PERRL None   Left PERRL None         Visual Fields       Left Right     Full   Restrictions Partial inner superior temporal, inferior temporal, superior nasal, inferior nasal deficiencies          Extraocular Movement       Right Left    Full Full         Neuro/Psych     Oriented x3: Yes   Mood/Affect: Normal         Dilation     Left eye: 2.5%  Phenylephrine, 1.0% Mydriacyl @ 1:00 PM           Slit Lamp and Fundus Exam     External Exam       Right Left   External Normal Normal         Slit Lamp Exam       Right Left   Lids/Lashes Normal Normal   Conjunctiva/Sclera White and quiet White and quiet   Cornea Clear Clear   Anterior Chamber Deep and quiet Deep and quiet   Iris Round and reactive Round and reactive   Lens Centered posterior chamber intraocular lens, PC clear Centered posterior chamber intraocular lens, PC clear   Anterior Vitreous Normal Normal         Fundus Exam       Right Left   Posterior Vitreous  Posterior vitreous detachment, Central vitreous floaters   Disc  Normal   C/D Ratio  0.7   Macula  Small subretinal  Hemorrhage with dark reddish hue in the inferior aspect of disciform scar Disciform scar, Retinal pigment epithelial detachment, Macular thickening, Cystoid macular edema, Advanced age related macular degeneration, Geographic atrophy   Vessels  Normal   Periphery  Normal            IMAGING AND PROCEDURES  Imaging and Procedures for 11/05/21  OCT, Retina - OU - Both Eyes       Right Eye Quality was good. Scan locations included subfoveal. Central Foveal Thickness: 228. Progression has improved. Findings include abnormal foveal contour, disciform scar.   Left Eye Quality was borderline. Scan locations included subfoveal. Central Foveal Thickness: 275. Progression has improved. Findings include no IRF, no SRF, disciform scar, subretinal scarring.   Notes Old disciform scar, RPE rip not in the fovea right eye, well some 2 weeks after most recent injection of disease activity temporal aspect of the macula as well as superior aspect of the macula   OS, now 4 months post injection intravitreal Avastin, much less retinal thickening, subretinal scarring accounts for acuity.  No residual subretinal fluid.  Yet clinically with small region of subretinal hemorrhage now dark reddish  hue seen, and now relatively flat lesion complex as compared to 2017.  Planned injection left eye today will convert treatment to right eye with a monocular eye involvement Failed CNVM      Intravitreal Injection, Pharmacologic Agent - OS - Left Eye       Time Out 11/05/2021. 1:09 PM. Confirmed correct patient, procedure,  site, and patient consented.   Anesthesia Topical anesthesia was used. Anesthetic medications included Lidocaine 4%.   Procedure Preparation included 5% betadine to ocular surface, 10% betadine to eyelids, Tobramycin 0.3%. A 30 gauge needle was used.   Injection: 2.5 mg bevacizumab 2.5 MG/0.1ML   Route: Intravitreal, Site: Left Eye   NDC: [phone], Lot: [id number], Expiration date: 01/08/2022   Post-op Post injection exam found visual acuity of at least counting fingers. The patient tolerated the procedure well. There were no complications. The patient received written and verbal post procedure care education. Post injection medications included ocuflox.              ASSESSMENT/PLAN:  Exudative age-related macular degeneration of right eye with active choroidal neovascularization (HCC) Improved post most recent injection Avastin OD, follow-up as scheduled  Exudative age-related macular degeneration of left eye with active choroidal neovascularization (HCC) Chronic active subfoveal disciform scar overall improved from onset years ago with large subfoveal PED vascularized.  Yet with scotoma from subfoveal scarring.  Currently at 83-month follow-up.  Risk of enlargement of scotoma due to edge of lesion activity.  Stable at 4 months today repeat injection today to maintain     ICD-10-CM   1. Exudative age-related macular degeneration of left eye with active choroidal neovascularization (HCC)  H35.3221 OCT, Retina - OU - Both Eyes    Intravitreal Injection, Pharmacologic Agent - OS - Left Eye    bevacizumab (AVASTIN) SOSY 2.5 mg    2. Exudative age-related  macular degeneration of right eye with active choroidal neovascularization (HCC)  H35.3211       1.  OS, today treatment to prevent scotoma enlargement with intravitreal Avastin repeat injection today reevaluate next in 4 months  2.  OD, much improved 2 weeks after most recent injection this monocular patient.  Follow-up OD next as scheduled likely injection again to maintain  3.  Ophthalmic Meds Ordered this visit:  Meds ordered this encounter  Medications   bevacizumab (AVASTIN) SOSY 2.5 mg       Return in about 4 months (around 03/07/2022) for dilate, OS, AVASTIN OCT,, follow-up OD as scheduled.  There are no Patient Instructions on file for this visit.   Explained the diagnoses, plan, and follow up with the patient and they expressed understanding.  Patient expressed understanding of the importance of proper follow up care.   Alford Highland Zuria Fosdick M.D. Diseases & Surgery of the Retina and Vitreous [facility] 11/05/21     Abbreviations: M myopia (nearsighted); A astigmatism; H hyperopia (farsighted); P presbyopia; Mrx spectacle prescription;  CTL contact lenses; OD right eye; OS left eye; OU both eyes  XT exotropia; ET esotropia; PEK punctate epithelial keratitis; PEE punctate epithelial erosions; DES dry eye syndrome; MGD meibomian gland dysfunction; ATs artificial tears; PFAT's preservative free artificial tears; NSC nuclear sclerotic cataract; PSC posterior subcapsular cataract; ERM epi-retinal membrane; PVD posterior vitreous detachment; RD retinal detachment; DM diabetes mellitus; DR diabetic retinopathy; NPDR non-proliferative diabetic retinopathy; PDR proliferative diabetic retinopathy; CSME clinically significant macular edema; DME diabetic macular edema; dbh dot blot hemorrhages; CWS cotton wool spot; POAG primary open angle glaucoma; C/D cup-to-disc ratio; HVF humphrey visual field; GVF goldmann visual field; OCT optical coherence tomography; IOP intraocular  pressure; BRVO Branch retinal vein occlusion; CRVO central retinal vein occlusion; CRAO central retinal artery occlusion; BRAO branch retinal artery occlusion; RT retinal tear; SB scleral buckle; PPV pars plana vitrectomy; VH Vitreous hemorrhage; PRP panretinal laser photocoagulation; IVK intravitreal kenalog; VMT vitreomacular  traction; MH Macular hole;  NVD neovascularization of the disc; NVE neovascularization elsewhere; AREDS age related eye disease study; ARMD age related macular degeneration; POAG primary open angle glaucoma; EBMD epithelial/anterior basement membrane dystrophy; ACIOL anterior chamber intraocular lens; IOL intraocular lens; PCIOL posterior chamber intraocular lens; Phaco/IOL phacoemulsification with intraocular lens placement; [place] photorefractive keratectomy; LASIK laser assisted in situ keratomileusis; HTN hypertension; DM diabetes mellitus; COPD chronic obstructive pulmonary disease

## 2021-11-05 NOTE — Assessment & Plan Note (Signed)
Improved post most recent injection Avastin OD, follow-up as scheduled

## 2021-11-05 NOTE — Assessment & Plan Note (Signed)
Chronic active subfoveal disciform scar overall improved from onset years ago with large subfoveal PED vascularized.  Yet with scotoma from subfoveal scarring.  Currently at 19-month follow-up.  Risk of enlargement of scotoma due to edge of lesion activity.  Stable at 4 months today repeat injection today to maintain

## 2021-11-24 ENCOUNTER — Encounter (INDEPENDENT_AMBULATORY_CARE_PROVIDER_SITE_OTHER): Payer: Self-pay | Admitting: Ophthalmology

## 2021-11-24 ENCOUNTER — Ambulatory Visit (INDEPENDENT_AMBULATORY_CARE_PROVIDER_SITE_OTHER): Payer: Medicare Other | Admitting: Ophthalmology

## 2021-11-24 DIAGNOSIS — H353211 Exudative age-related macular degeneration, right eye, with active choroidal neovascularization: Secondary | ICD-10-CM | POA: Diagnosis not present

## 2021-11-24 DIAGNOSIS — H353221 Exudative age-related macular degeneration, left eye, with active choroidal neovascularization: Secondary | ICD-10-CM

## 2021-11-24 DIAGNOSIS — H353124 Nonexudative age-related macular degeneration, left eye, advanced atrophic with subfoveal involvement: Secondary | ICD-10-CM

## 2021-11-24 MED ORDER — BEVACIZUMAB 2.5 MG/0.1ML IZ SOSY
2.5000 mg | PREFILLED_SYRINGE | INTRAVITREAL | Status: AC | PRN
  Administered 2021-11-24: 2.5 mg via INTRAVITREAL

## 2021-11-24 NOTE — Progress Notes (Signed)
11/24/2021     CHIEF COMPLAINT Patient presents for  Chief Complaint  Patient presents with   Macular Degeneration      HISTORY OF PRESENT ILLNESS: Juan Reed is a 76 y.o. male who presents to the clinic today for:   HPI   5 weeks for DILATE, OD AVASTIN OCT. Pt stated vision has been stable since last visit.  Last edited by Angeline Slim on 11/24/2021 10:19 AM.      Referring physician: [name], MD [address] [place],  [place] [address]  HISTORICAL INFORMATION:   Selected notes from the MEDICAL RECORD NUMBER       CURRENT MEDICATIONS: Current Outpatient Medications (Ophthalmic Drugs)  Medication Sig   ranibizumab (LUCENTIS) 0.5 MG/0.05ML SOLN 0.5 mg by Intravitreal route every 3 (three) months. Alternates eyes between   timolol (TIMOPTIC) 0.5 % ophthalmic solution INSTILL 1 DROP IN LEFT EYE DAILY AS DIRECTED   No current facility-administered medications for this visit. (Ophthalmic Drugs)   Current Outpatient Medications (Other)  Medication Sig   aspirin EC 325 MG EC tablet Take 1 tablet (325 mg total) by mouth 2 (two) times daily after a meal.   losartan-hydrochlorothiazide (HYZAAR) 100-25 MG per tablet Take 1 tablet by mouth every morning.   Multiple Vitamins-Minerals (CENTRUM SILVER ULTRA MENS PO) Take 1 tablet by mouth.   OVER THE COUNTER MEDICATION Lutein 25mg   With Zeaxanthin  TAB dAILY   oxyCODONE-acetaminophen (ROXICET) 5-325 MG per tablet Take 1-2 tablets by mouth every 4 (four) hours as needed for severe pain.   predniSONE (DELTASONE) 50 MG tablet Take one 50 mg tablet once daily for the next five days.   No current facility-administered medications for this visit. (Other)      REVIEW OF SYSTEMS: ROS   Negative for: Constitutional, Gastrointestinal, Neurological, Skin, Genitourinary, Musculoskeletal, HENT, Endocrine, Cardiovascular, Eyes, Respiratory, Psychiatric, Allergic/Imm, Heme/Lymph Last edited by on 11/24/2021 10:19 AM.        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 No pertinent family history.  SOCIAL HISTORY Social History   Tobacco Use   Smoking status: Former    Packs/day: 1.00    Years: 10.00    Total pack years: 10.00    Types: Cigarettes    Quit date: 10/23/1979    Years since quitting: 42.1   Smokeless tobacco: Never  Substance Use Topics   Alcohol use: Yes    Comment: "750 ml week(some weeks) liquor"   Drug use: No         OPHTHALMIC EXAM:  Base Eye Exam     Visual Acuity (ETDRS)  Right Left   Dist cc 20/60 CF at 3'   Dist ph cc 20/50 -2 +1     Correction: Glasses         Tonometry (Tonopen, 10:23 AM)       Right Left   Pressure 15 15         Pupils       Pupils APD   Right PERRL None   Left PERRL None         Visual Fields       Left Right     Full   Restrictions Partial inner superior temporal, inferior temporal, superior nasal, inferior nasal deficiencies          Extraocular Movement       Right Left    Full Full         Neuro/Psych     Oriented x3: Yes   Mood/Affect: Normal         Dilation     Right  eye: 2.5% Phenylephrine, 1.0% Mydriacyl @ 10:23 AM           Slit Lamp and Fundus Exam     External Exam       Right Left   External Normal Normal         Slit Lamp Exam       Right Left   Lids/Lashes Normal Normal   Conjunctiva/Sclera White and quiet White and quiet   Cornea Clear Clear   Anterior Chamber Deep and quiet Deep and quiet   Iris Round and reactive Round and reactive   Lens Centered posterior chamber intraocular lens, PC clear Centered posterior chamber intraocular lens, PC clear   Anterior Vitreous Normal Normal         Fundus Exam       Right Left   Posterior Vitreous Posterior vitreous detachment    Disc Normal    C/D Ratio 0.45    Macula Geographic atrophy not in faz specifically located temporally, representing an area of RPE rip, no exudates, along ST arcade and temporal aspect to FAZ subretinal hemorrhage, Macular thickening    Vessels Normal    Periphery Normal             IMAGING AND PROCEDURES  Imaging and Procedures for 11/24/21  OCT, Retina - OU - Both Eyes       Right Eye Quality was good. Scan locations included subfoveal. Central Foveal Thickness: 232. Progression has improved. Findings include abnormal foveal contour, disciform scar.   Left Eye Quality was borderline. Scan locations included subfoveal. Central Foveal Thickness: 270. Progression has been stable. Findings include no IRF, no SRF, disciform scar, subretinal scarring.   Notes Old disciform scar, RPE rip not in the fovea right eye, well some 5 weeks after most recent injection of disease activity temporal aspect of the macula as well as superior aspect of the macula repeat injection today to maintain   OS, 2 weeks post injection intravitreal Avastin, much less retinal thickening, subretinal scarring accounts for acuity.  No residual subretinal fluid.  Yet clinically with small region of subretinal hemorrhage now dark reddish hue seen, and now relatively flat lesion  complex as compared to 2017.  Continued evaluation OS as scheduled       Intravitreal Injection, Pharmacologic Agent - OD - Right Eye       Time Out 11/24/2021. 10:55 AM. Confirmed correct patient, procedure, site, and patient consented.   Anesthesia Topical anesthesia was used. Anesthetic medications included  Lidocaine 4%.   Procedure Preparation included 5% betadine to ocular surface, 10% betadine to eyelids. A 30 gauge needle was used.   Injection: 2.5 mg bevacizumab 2.5 MG/0.1ML   Route: Intravitreal, Site: Right Eye   NDC: [phone], Lot: [id number], Expiration date: 01/17/2022, Waste: 0 mL   Post-op Post injection exam found visual acuity of at least counting fingers. The patient tolerated the procedure well. There were no complications. The patient received written and verbal post procedure care education. Post injection medications were not given.              ASSESSMENT/PLAN:  Exudative age-related macular degeneration of right eye with active choroidal neovascularization (HCC) OD, improved post most recent Restart of medications month previous, will repeat Avastin today and maintain  Exudative age-related macular degeneration of left eye with active choroidal neovascularization (HCC) At 2 weeks post most recent injection into vegF OS, improved macular anatomy hence lesion is reduced, and responsive to therapy, follow-up as scheduled     ICD-10-CM   1. Exudative age-related macular degeneration of right eye with active choroidal neovascularization (HCC)  H35.3211 OCT, Retina - OU - Both Eyes    Intravitreal Injection, Pharmacologic Agent - OD - Right Eye    bevacizumab (AVASTIN) SOSY 2.5 mg    2. Advanced nonexudative age-related macular degeneration of left eye with subfoveal involvement  H35.3124     3. Exudative age-related macular degeneration of left eye with active choroidal neovascularization (HCC)  H35.3221       1.  Repeat injection Avastin OD today  to maintain improved macular anatomy after restart of therapy June 2023.  Follow-up again in 1 month to 6 weeks  2.  OS follow-up as scheduled  3.  Ophthalmic Meds Ordered this visit:  Meds ordered this encounter  Medications   bevacizumab (AVASTIN) SOSY 2.5 mg       Return in about 6 weeks (around 01/05/2022) for dilate, OD, AVASTIN OCT,, and follow-up OS Avastin OCT as scheduled.  There are no Patient Instructions on file for this visit.   Explained the diagnoses, plan, and follow up with the patient and they expressed understanding.  Patient expressed understanding of the importance of proper follow up care.   Alford Highland Yeray Tomas M.D. Diseases & Surgery of the Retina and Vitreous [facility] 11/24/21     Abbreviations: M myopia (nearsighted); A astigmatism; H hyperopia (farsighted); P presbyopia; Mrx spectacle prescription;  CTL contact lenses; OD right eye; OS left eye; OU both eyes  XT exotropia; ET esotropia; PEK punctate epithelial keratitis; PEE punctate epithelial erosions; DES dry eye syndrome; MGD meibomian gland dysfunction; ATs artificial tears; PFAT's preservative free artificial tears; NSC nuclear sclerotic cataract; PSC posterior subcapsular cataract; ERM epi-retinal membrane; PVD posterior vitreous detachment; RD retinal detachment; DM diabetes mellitus; DR diabetic retinopathy; NPDR non-proliferative diabetic retinopathy; PDR proliferative diabetic retinopathy; CSME clinically significant macular edema; DME diabetic macular edema; dbh dot blot hemorrhages; CWS cotton wool spot; POAG primary open angle glaucoma; C/D cup-to-disc ratio; HVF humphrey visual field; GVF goldmann visual field; OCT optical coherence tomography; IOP intraocular pressure; BRVO Branch retinal vein occlusion; CRVO central retinal vein occlusion; CRAO central retinal artery occlusion; BRAO branch retinal artery occlusion; RT retinal tear; SB scleral buckle; PPV pars plana vitrectomy; VH  Vitreous hemorrhage; PRP panretinal laser photocoagulation; IVK intravitreal kenalog; VMT vitreomacular traction; MH Macular hole;  NVD neovascularization of the disc; NVE neovascularization elsewhere; AREDS age related eye disease study; ARMD age related macular degeneration;  POAG primary open angle glaucoma; EBMD epithelial/anterior basement membrane dystrophy; ACIOL anterior chamber intraocular lens; IOL intraocular lens; PCIOL posterior chamber intraocular lens; Phaco/IOL phacoemulsification with intraocular lens placement; [place] photorefractive keratectomy; LASIK laser assisted in situ keratomileusis; HTN hypertension; DM diabetes mellitus; COPD chronic obstructive pulmonary disease

## 2021-11-24 NOTE — Assessment & Plan Note (Signed)
OD, improved post most recent Restart of medications month previous, will repeat Avastin today and maintain

## 2021-11-24 NOTE — Assessment & Plan Note (Deleted)
At 2 weeks post most recent injection into vegF OS, improved macular anatomy hence lesion is reduced, and responsive to therapy, follow-up as scheduled 

## 2021-11-24 NOTE — Assessment & Plan Note (Signed)
At 2 weeks post most recent injection into vegF OS, improved macular anatomy hence lesion is reduced, and responsive to therapy, follow-up as scheduled

## 2022-01-05 ENCOUNTER — Ambulatory Visit (INDEPENDENT_AMBULATORY_CARE_PROVIDER_SITE_OTHER): Payer: Medicare Other | Admitting: Ophthalmology

## 2022-01-05 ENCOUNTER — Encounter (INDEPENDENT_AMBULATORY_CARE_PROVIDER_SITE_OTHER): Payer: Self-pay | Admitting: Ophthalmology

## 2022-01-05 DIAGNOSIS — H353211 Exudative age-related macular degeneration, right eye, with active choroidal neovascularization: Secondary | ICD-10-CM

## 2022-01-05 DIAGNOSIS — H353221 Exudative age-related macular degeneration, left eye, with active choroidal neovascularization: Secondary | ICD-10-CM | POA: Diagnosis not present

## 2022-01-05 MED ORDER — BEVACIZUMAB CHEMO INJECTION 1.25MG/0.05ML SYRINGE FOR KALEIDOSCOPE
1.2500 mg | INTRAVITREAL | Status: AC | PRN
  Administered 2022-01-05: 1.25 mg via INTRAVITREAL

## 2022-01-05 NOTE — Progress Notes (Signed)
01/05/2022     CHIEF COMPLAINT Patient presents for  Chief Complaint  Patient presents with   Macular Degeneration      HISTORY OF PRESENT ILLNESS: Juan Reed is a 76 y.o. male who presents to the clinic today for:   HPI     6 weeks for DILATE OD, AVASTIN OCT. Pt stated vision has not changed since last visit.  Last edited by Edmon Crape, MD on 01/05/2022 11:30 AM.      Referring physician: [name], MD [address] [place],  [place] [address]  HISTORICAL INFORMATION:   Selected notes from the MEDICAL RECORD NUMBER       CURRENT MEDICATIONS: Current Outpatient Medications (Ophthalmic Drugs)  Medication Sig   ranibizumab (LUCENTIS) 0.5 MG/0.05ML SOLN 0.5 mg by Intravitreal route every 3 (three) months. Alternates eyes between   timolol (TIMOPTIC) 0.5 % ophthalmic solution INSTILL 1 DROP IN LEFT EYE DAILY AS DIRECTED   No current facility-administered medications for this visit. (Ophthalmic Drugs)   Current Outpatient Medications (Other)  Medication Sig   aspirin EC 325 MG EC tablet Take 1 tablet (325 mg total) by mouth 2 (two) times daily after a meal.   losartan-hydrochlorothiazide (HYZAAR) 100-25 MG per tablet Take 1 tablet by mouth every morning.   Multiple Vitamins-Minerals (CENTRUM SILVER ULTRA MENS PO) Take 1 tablet by mouth.   OVER THE COUNTER MEDICATION Lutein 25mg   With Zeaxanthin  TAB dAILY   oxyCODONE-acetaminophen (ROXICET) 5-325 MG per tablet Take 1-2 tablets by mouth every 4 (four) hours as needed for severe pain.   predniSONE (DELTASONE) 50 MG tablet Take one 50 mg tablet once daily for the next five days.   No current facility-administered medications for this visit. (Other)      REVIEW OF SYSTEMS: ROS   Negative for: Constitutional, Gastrointestinal, Neurological, Skin, Genitourinary, Musculoskeletal, HENT, Endocrine, Cardiovascular, Eyes, Respiratory, Psychiatric, Allergic/Imm, Heme/Lymph Last edited by on 01/05/2022  10:41 AM.       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 No pertinent family history.  SOCIAL HISTORY Social History   Tobacco Use   Smoking status: Former    Packs/day: 1.00    Years: 10.00    Total pack years: 10.00    Types: Cigarettes    Quit date: 10/23/1979    Years since quitting: 42.2   Smokeless tobacco: Never  Substance Use Topics   Alcohol use: Yes    Comment: "750 ml week(some weeks) liquor"   Drug use: No         OPHTHALMIC EXAM:  Base Eye Exam  Visual Acuity (ETDRS)       Right Left   Dist cc 20/50 -1 CF at 3'   Dist ph cc NI     Correction: Glasses         Tonometry (Tonopen, 10:45 AM)       Right Left   Pressure 14 14         Pupils       Pupils APD   Right PERRL None   Left PERRL None         Visual Fields       Left Right     Full   Restrictions Partial inner superior temporal, inferior temporal, superior nasal, inferior nasal deficiencies          Extraocular Movement       Right Left    Full, Ortho Full, Ortho         Neuro/Psych     Oriented x3: Yes   Mood/Affect: Normal          Dilation     Right eye: 2.5% Phenylephrine, 1.0% Mydriacyl @ 10:45 AM           Slit Lamp and Fundus Exam     External Exam       Right Left   External Normal Normal         Slit Lamp Exam       Right Left   Lids/Lashes Normal Normal   Conjunctiva/Sclera White and quiet White and quiet   Cornea Clear Clear   Anterior Chamber Deep and quiet Deep and quiet   Iris Round and reactive Round and reactive   Lens Centered posterior chamber intraocular lens, PC clear Centered posterior chamber intraocular lens, PC clear   Anterior Vitreous Normal Normal         Fundus Exam       Right Left   Posterior Vitreous Posterior vitreous detachment    Disc Normal    C/D Ratio 0.45    Macula Geographic atrophy not in faz specifically located temporally, representing an area of RPE rip, no exudates, along ST arcade and temporal aspect to FAZ subretinal hemorrhage, Macular thickening    Vessels Normal    Periphery Normal             IMAGING AND PROCEDURES  Imaging and Procedures for 01/05/22  OCT, Retina - OU - Both Eyes       Right Eye Quality was good. Scan locations included subfoveal. Central Foveal Thickness: 228. Progression has improved. Findings include abnormal foveal contour, disciform scar.   Left Eye Quality was borderline. Scan locations included subfoveal. Central Foveal Thickness: 263. Progression has been stable. Findings include no IRF, no SRF, disciform scar, subretinal scarring.   Notes Old disciform scar, RPE rip not in the fovea right eye, well some 6  weeks after most recent injection of disease activity temporal aspect of the macula as well as superior aspect of the macula repeat injection today to maintain   OS, 8 weeks post injection intravitreal Avastin, much less retinal thickening, subretinal scarring accounts for acuity.  No residual subretinal fluid.  Yet clinically with small region of subretinal hemorrhage now dark reddish hue seen, and now  relatively flat lesion complex as compared to 2017.  Continued evaluation OS as scheduled        Intravitreal Injection, Pharmacologic Agent - OD - Right Eye       Time Out 01/05/2022. 11:35 AM. Confirmed correct patient, procedure, site, and  patient consented.   Anesthesia Topical anesthesia was used. Anesthetic medications included Lidocaine 4%.   Procedure Preparation included 5% betadine to ocular surface, 10% betadine to eyelids. A 30 gauge needle was used.   Injection: 1.25 mg Bevacizumab 1.25mg /0.9ml   Route: Intravitreal, Site: Right Eye   NDC: [id number], Lot: [id number], Expiration date: 03/24/2022   Post-op Post injection exam found visual acuity of at least counting fingers. The patient tolerated the procedure well. There were no complications. The patient received written and verbal post procedure care education. Post injection medications were not given.              ASSESSMENT/PLAN:  Exudative age-related macular degeneration of left eye with active choroidal neovascularization (HCC) OS follow-up as scheduled in 2 more months to reevaluate  Exudative age-related macular degeneration of right eye with active choroidal neovascularization (HCC) OD current interval evaluation is 6 weeks.  Repeat injection today to maintain reevaluate next in 8 weeks     ICD-10-CM   1. Exudative age-related macular degeneration of right eye with active choroidal neovascularization (HCC)  H35.3211 OCT, Retina - OU - Both Eyes    Intravitreal Injection, Pharmacologic Agent - OD - Right Eye    Bevacizumab (AVASTIN) SOLN 1.25 mg    2. Exudative age-related macular degeneration of left eye with active choroidal neovascularization (HCC)  H35.3221       1.  OD with stable acuity.  RPE rip previously associated with large PED resolving with vascularized PED CNVM with therapy.  Stable acuity.  Currently at 6-week interval.  Repeat injection today and reevaluate next OD in 8  weeks  2.  Coincidentally OS do also need evaluated and dilated end of October 2023.  Dilate OU next  3.  Ophthalmic Meds Ordered this visit:  Meds ordered this encounter  Medications   Bevacizumab (AVASTIN) SOLN 1.25 mg       Return in about 8 weeks (around 03/02/2022) for DILATE OU, AVASTIN OCT, OD,, possible OS.  There are no Patient Instructions on file for this visit.   Explained the diagnoses, plan, and follow up with the patient and they expressed understanding.  Patient expressed understanding of the importance of proper follow up care.   Alford Highland Juno Bozard M.D. Diseases & Surgery of the Retina and Vitreous [facility] 01/05/22     Abbreviations: M myopia (nearsighted); A astigmatism; H hyperopia (farsighted); P presbyopia; Mrx spectacle prescription;  CTL contact lenses; OD right eye; OS left eye; OU both eyes  XT exotropia; ET esotropia; PEK punctate epithelial keratitis; PEE punctate epithelial erosions; DES dry eye syndrome; MGD meibomian gland dysfunction; ATs artificial tears; PFAT's preservative free artificial tears; NSC nuclear sclerotic cataract; PSC posterior subcapsular cataract; ERM epi-retinal membrane; PVD posterior vitreous detachment; RD retinal detachment; DM diabetes mellitus; DR diabetic retinopathy; NPDR non-proliferative diabetic retinopathy; PDR proliferative diabetic retinopathy; CSME clinically significant macular edema; DME diabetic macular edema; dbh dot blot hemorrhages; CWS cotton wool spot; POAG primary open angle glaucoma; C/D cup-to-disc ratio; HVF humphrey visual field; GVF goldmann visual field; OCT optical coherence tomography; IOP intraocular pressure; BRVO Branch retinal vein occlusion; CRVO central retinal vein occlusion; CRAO central retinal artery occlusion; BRAO branch retinal artery occlusion; RT retinal tear; SB scleral buckle; PPV pars plana vitrectomy; VH Vitreous hemorrhage; PRP panretinal laser photocoagulation; IVK  intravitreal kenalog; VMT vitreomacular traction; MH Macular hole;  NVD neovascularization of the disc; NVE neovascularization elsewhere; AREDS age related eye disease study; ARMD age related macular degeneration; POAG  primary open angle glaucoma; EBMD epithelial/anterior basement membrane dystrophy; ACIOL anterior chamber intraocular lens; IOL intraocular lens; PCIOL posterior chamber intraocular lens; Phaco/IOL phacoemulsification with intraocular lens placement; [place] photorefractive keratectomy; LASIK laser assisted in situ keratomileusis; HTN hypertension; DM diabetes mellitus; COPD chronic obstructive pulmonary disease

## 2022-01-05 NOTE — Assessment & Plan Note (Signed)
OD current interval evaluation is 6 weeks.  Repeat injection today to maintain reevaluate next in 8 weeks

## 2022-01-05 NOTE — Assessment & Plan Note (Signed)
OS follow-up as scheduled in 2 more months to reevaluate

## 2022-03-02 ENCOUNTER — Encounter (INDEPENDENT_AMBULATORY_CARE_PROVIDER_SITE_OTHER): Payer: Medicare Other | Admitting: Ophthalmology

## 2022-03-08 ENCOUNTER — Encounter (INDEPENDENT_AMBULATORY_CARE_PROVIDER_SITE_OTHER): Payer: Medicare Other | Admitting: Ophthalmology

## 2022-06-22 ENCOUNTER — Telehealth: Payer: Self-pay

## 2022-06-22 NOTE — Patient Instructions (Signed)
Visit Information  Thank you for taking time to visit with me today. Please don't hesitate to contact me if I can be of assistance to you.   Following are the goals we discussed today:   Goals Addressed             This Visit's Progress    COMPLETED: Care Coordination Activities - no follow up required       Care Coordination Interventions: Assessed social determinant of health barriers Interventions Today    Flowsheet Row Most Recent Value  General Interventions   General Interventions Discussed/Reviewed General Interventions Discussed, Doctor Visits  Doctor Visits Discussed/Reviewed Doctor Visits Discussed, Annual Wellness Visits, PCP  PCP/Specialist Visits Compliance with follow-up visit  Education Interventions   Education Provided Provided Education  Provided Verbal Education On When to see the doctor, Other  [care coordination services]               If you are experiencing a Mental Health or [place] or need someone to talk to, please call the Suicide and Crisis Lifeline: 988 call the [place] National Suicide Prevention Lifeline: (289)678-8729 or TTY: 309 214 5493 [place] [phone]) to talk to a trained counselor call 1-800-273-TALK (toll free, 24 hour hotline) go to [facility] [address] [place] [phone]) call 911   Patient verbalizes understanding of instructions and care plan provided today and agrees to view in [place]. Active MyChart status and patient understanding of how to access instructions and care plan via MyChart confirmed with patient.     No further follow up required:    Peter Garter RN, Jackquline [place], [place] [facility] [phone]

## 2022-06-22 NOTE — Patient Outreach (Signed)
  Care Coordination   Initial Visit Note   06/22/2022 Name: CJ EDGELL MRN: [id number] DOB: May 29, 1945  Almond Lint Alumbaugh is a 77 y.o. year old male who sees Lawerance Cruel, MD for primary care. I spoke with  Almond Lint Petron by phone today.  What matters to the patients health and wellness today?  No concerns today    Goals Addressed             This Visit's Progress    COMPLETED: Care Coordination Activities - no follow up required       Care Coordination Interventions: Assessed social determinant of health barriers Interventions Today    Flowsheet Row Most Recent Value  General Interventions   General Interventions Discussed/Reviewed General Interventions Discussed, Doctor Visits  Doctor Visits Discussed/Reviewed Doctor Visits Discussed, Annual Wellness Visits, PCP  PCP/Specialist Visits Compliance with follow-up visit  Education Interventions   Education Provided Provided Education  Provided Verbal Education On When to see the doctor, Other  [care coordination services]               SDOH assessments and interventions completed:  Yes  SDOH Interventions Today    Flowsheet Row Most Recent Value  SDOH Interventions   Food Insecurity Interventions Intervention Not Indicated  Housing Interventions Intervention Not Indicated  Transportation Interventions Intervention Not Indicated  Utilities Interventions Intervention Not Indicated        Care Coordination Interventions:  Yes, provided   Follow up plan: No further intervention required.   Encounter Outcome:  Pt. Visit Completed  Peter Garter RN, BSN,CCM, CDE Care Management Coordinator [facility] [phone]

## 2023-02-14 ENCOUNTER — Encounter: Payer: Self-pay | Admitting: Nurse Practitioner

## 2023-02-14 ENCOUNTER — Ambulatory Visit (INDEPENDENT_AMBULATORY_CARE_PROVIDER_SITE_OTHER): Payer: Medicare Other | Admitting: Nurse Practitioner

## 2023-02-14 VITALS — BP 124/78 | HR 100 | Temp 98.7°F | Ht 70.75 in | Wt 267.6 lb

## 2023-02-14 DIAGNOSIS — Z125 Encounter for screening for malignant neoplasm of prostate: Secondary | ICD-10-CM

## 2023-02-14 DIAGNOSIS — Z96649 Presence of unspecified artificial hip joint: Secondary | ICD-10-CM | POA: Diagnosis not present

## 2023-02-14 DIAGNOSIS — H353221 Exudative age-related macular degeneration, left eye, with active choroidal neovascularization: Secondary | ICD-10-CM | POA: Diagnosis not present

## 2023-02-14 DIAGNOSIS — H353211 Exudative age-related macular degeneration, right eye, with active choroidal neovascularization: Secondary | ICD-10-CM | POA: Diagnosis not present

## 2023-02-14 DIAGNOSIS — E669 Obesity, unspecified: Secondary | ICD-10-CM | POA: Diagnosis not present

## 2023-02-14 DIAGNOSIS — Z13220 Encounter for screening for lipoid disorders: Secondary | ICD-10-CM

## 2023-02-14 DIAGNOSIS — Z1159 Encounter for screening for other viral diseases: Secondary | ICD-10-CM

## 2023-02-14 DIAGNOSIS — Z131 Encounter for screening for diabetes mellitus: Secondary | ICD-10-CM

## 2023-02-14 DIAGNOSIS — I10 Essential (primary) hypertension: Secondary | ICD-10-CM | POA: Insufficient documentation

## 2023-02-14 MED ORDER — LOSARTAN POTASSIUM-HCTZ 100-25 MG PO TABS
1.0000 | ORAL_TABLET | Freq: Every morning | ORAL | 2 refills | Status: DC
Start: 2023-02-14 — End: 2023-11-17

## 2023-02-14 NOTE — Progress Notes (Signed)
New Patient Office Visit  Subjective    Patient ID: Juan Reed, male    DOB: 03-Jan-1946  Age: 77 y.o. MRN: [id number]  CC:  Chief Complaint  Patient presents with   Establish Care   Medication Refill    Losartan-hydrochlorothiazide 100-25 mg . States he's got a week left of pills.     HPI Juan Reed presents to establish care   HTN: states that he does. States that he tolerate the medication  Macular degeneration: Dr Luciana Axe. States that he sees him approx every 3 month  Tdap 2013 can update at local pharmacy if the need arises Flu: refused Covid: refused Shingles: refused [id number]  Colonoscopy 2013, repeat over due.  Patient does not want to continue screening due to age PSA:  due   Outpatient Encounter Medications as of 02/14/2023  Medication Sig   Multiple Vitamins-Minerals (CENTRUM SILVER ULTRA MENS PO) Take 1 tablet by mouth.   [DISCONTINUED] losartan-hydrochlorothiazide (HYZAAR) 100-25 MG per tablet Take 1 tablet by mouth every morning.   losartan-hydrochlorothiazide (HYZAAR) 100-25 MG tablet Take 1 tablet by mouth every morning.   OVER THE COUNTER MEDICATION Lutein 25mg   With Zeaxanthin  TAB dAILY (Patient not taking: Reported on 02/14/2023)   ranibizumab (LUCENTIS) 0.5 MG/0.05ML SOLN 0.5 mg by Intravitreal route every 3 (three) months. Alternates eyes between (Patient not taking: Reported on 02/14/2023)   timolol (TIMOPTIC) 0.5 % ophthalmic solution INSTILL 1 DROP IN LEFT EYE DAILY AS DIRECTED (Patient not taking: Reported on 02/14/2023)   [DISCONTINUED] aspirin EC 325 MG EC tablet Take 1 tablet (325 mg total) by mouth 2 (two) times daily after a meal.   [DISCONTINUED] oxyCODONE-acetaminophen (ROXICET) 5-325 MG per tablet Take 1-2 tablets by mouth every 4 (four) hours as needed for severe pain.   [DISCONTINUED] predniSONE (DELTASONE) 50 MG tablet Take one 50 mg tablet once daily for the next five days.   No facility-administered encounter medications on file as of  02/14/2023.    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) 09/05/2019   AntivegF therapy in the past right eye from active disease onset late 2014 through the mid 2018-year  June 2023 reactivated   Glaucoma    Hyperlipidemia, mixed    Hypertension    Macular degeneration of both eyes 2012   receiving injections    Past Surgical History:  Procedure Laterality Date   COLONOSCOPY     x 3   EYE SURGERY     JOINT REPLACEMENT     NASAL POLYP SURGERY     TOTAL HIP ARTHROPLASTY Left 11/02/2013   Procedure: LEFT TOTAL HIP ARTHROPLASTY ANTERIOR APPROACH;  Surgeon: Kathryne Hitch, MD;  Location: [facility];  Service: Orthopedics;  Laterality: Left;    Family History  Problem Relation Age of Onset   Cancer Mother    Drug abuse Son     Social History   Socioeconomic History   Marital status: Married    Spouse name: nancy   Number of children: 2   Years of education: Not on file   Highest education level: Not on file  Occupational History   Not on file  Tobacco Use   Smoking status: Former    Current packs/day: 0.00    Average packs/day: 1 pack/day for 10.0 years (10.0 ttl pk-yrs)  Types: Cigarettes    Start date: 10/22/1969    Quit date: 10/23/1979    Years since quitting: 43.3   Smokeless tobacco: Never  Vaping Use   Vaping status: Never Used  Substance and Sexual Activity   Alcohol use: Yes    Alcohol/week: 12.0 standard drinks of alcohol    Types: 6 Cans of beer, 6 Standard drinks or equivalent per week    Comment: "750 ml week(some weeks) liquor"   Drug use: Never   Sexual activity: Yes    Birth control/protection: None  Other Topics Concern   Not on file  Social History Narrative   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      [employer] (32)   Social  Determinants of Health   Financial Resource Strain: Medium Risk (02/10/2023)   Overall Financial Resource Strain (CARDIA)    Difficulty of Paying Living Expenses: Somewhat hard  Food Insecurity: Food Insecurity Present (02/10/2023)   Hunger Vital Sign    Worried About Running Out of Food in the Last Year: Sometimes true    Ran Out of Food in the Last Year: Sometimes true  Transportation Needs: No Transportation Needs (02/10/2023)   PRAPARE - Administrator, Civil Service (Medical): No    Lack of Transportation (Non-Medical): No  Physical Activity: Unknown (02/10/2023)   Exercise Vital Sign    Days of Exercise per Week: 0 days    Minutes of Exercise per Session: Not on file  Stress: Stress Concern Present (02/10/2023)   [employer] of Occupational Health - Occupational Stress Questionnaire    Feeling of Stress : Rather much  Social Connections: Socially Isolated (02/10/2023)   Social Connection and Isolation Panel [NHANES]    Frequency of Communication with Friends and Family: Never    Frequency of Social Gatherings with Friends and Family: Never    Attends Religious Services: Never    Database administrator or Organizations: No    Attends Engineer, structural: Not on file    Marital Status: Married  Catering manager Violence: Not on file    Review of Systems  Constitutional:  Negative for chills and fever.  Respiratory:  Negative for shortness of breath.   Cardiovascular:  Negative for chest pain and leg swelling.  Gastrointestinal:  Negative for abdominal pain, blood in stool, constipation, diarrhea, nausea and vomiting.       Bm daily   Genitourinary:  Negative for dysuria and hematuria.       Nocturia 3   Neurological:  Negative for dizziness, tingling and headaches.  Psychiatric/Behavioral:  Negative for hallucinations and suicidal ideas.         Objective    BP 124/78   Pulse 100   Temp 98.7 F (37.1 C) (Oral)   Ht 5' 10.75" (1.797 m)    Wt 267 lb 9.6 oz (121.4 kg)   SpO2 98%   BMI 37.59 kg/m   Physical Exam Vitals and nursing note reviewed.  Constitutional:      Appearance: Normal appearance.  HENT:     Right Ear: Tympanic membrane, ear canal and external ear normal.     Left Ear: Tympanic membrane, ear canal and external ear normal.     Mouth/Throat:     Mouth: Mucous membranes are moist.     Pharynx: Oropharynx is clear.  Eyes:     Extraocular Movements: Extraocular movements intact.     Pupils: Pupils are equal, round, and  reactive to light.  Cardiovascular:     Rate and Rhythm: Normal rate and regular rhythm.     Pulses: Normal pulses.     Heart sounds: Normal heart sounds.  Pulmonary:     Effort: Pulmonary effort is normal.     Breath sounds: Normal breath sounds.  Musculoskeletal:     Right lower leg: 1+ Edema present.     Left lower leg: 1+ Edema present.  Lymphadenopathy:     Cervical: No cervical adenopathy.  Neurological:     General: No focal deficit present.     Mental Status: He is alert.     Deep Tendon Reflexes:     Reflex Scores:      Bicep reflexes are 1+ on the right side and 1+ on the left side.      Patellar reflexes are 1+ on the right side and 1+ on the left side.    Comments: Bilateral upper and lower extremity strength 5/5         Assessment & Plan:   Problem List Items Addressed This Visit       Cardiovascular and Mediastinum   Exudative age-related macular degeneration of left eye with active choroidal neovascularization [facility])    Patient currently managed by Dr. Fawn Kirk and does intravitreal injections.  Continue following with specialist as recommended taking medication as prescribed      Relevant Medications   losartan-hydrochlorothiazide (HYZAAR) 100-25 MG tablet   Exudative age-related macular degeneration of right eye with active choroidal neovascularization [facility])    Patient currently managed by Dr. Fawn Kirk and does intravitreal injections.  Continue  following with specialist as recommended taking medication as prescribed.      Relevant Medications   losartan-hydrochlorothiazide (HYZAAR) 100-25 MG tablet   Primary hypertension - Primary    Patient currently maintained on losartan-hydrochlorothiazide.  Blood pressure under good control.  He is tolerating medication well.  Refill provided today continue medication as prescribed      Relevant Medications   losartan-hydrochlorothiazide (HYZAAR) 100-25 MG tablet   Other Relevant Orders   CBC   Comprehensive metabolic panel   TSH     Other   Status post THR (total hip replacement)   Obesity (BMI 30-39.9)    Pending TSH, A1c, lipid panel.      Relevant Orders   Lipid panel   Hemoglobin A1c   Other Visit Diagnoses     Encounter for hepatitis C screening test for low risk patient       Relevant Orders   Hepatitis C Antibody   Screening for diabetes mellitus       Relevant Orders   Hemoglobin A1c   Screening for prostate cancer       Relevant Orders   PSA, Medicare   Screening for lipid disorders       Relevant Orders   Lipid panel       Return in about 6 months (around 08/15/2023) for BP/nocturia recheck .   Audria Nine, NP

## 2023-02-14 NOTE — Assessment & Plan Note (Signed)
Patient currently maintained on losartan-hydrochlorothiazide.  Blood pressure under good control.  He is tolerating medication well.  Refill provided today continue medication as prescribed

## 2023-02-14 NOTE — Assessment & Plan Note (Signed)
Patient currently managed by Dr. Fawn Kirk and does intravitreal injections.  Continue following with specialist as recommended taking medication as prescribed.

## 2023-02-14 NOTE — Assessment & Plan Note (Signed)
Pending TSH, A1c, lipid panel.

## 2023-02-14 NOTE — Patient Instructions (Signed)
Nice to see you today I will be in touch with the labs once I have reviewed them Follow up with me in 6 months, sooner If you need me

## 2023-02-15 LAB — COMPREHENSIVE METABOLIC PANEL
ALT: 28 U/L (ref 0–53)
AST: 22 U/L (ref 0–37)
Albumin: 4.3 g/dL (ref 3.5–5.2)
Alkaline Phosphatase: 55 U/L (ref 39–117)
BUN: 15 mg/dL (ref 6–23)
CO2: 32 meq/L (ref 19–32)
Calcium: 10.1 mg/dL (ref 8.4–10.5)
Chloride: 97 meq/L (ref 96–112)
Creatinine, Ser: 0.96 mg/dL (ref 0.40–1.50)
GFR: 76.71 mL/min (ref >60.00)
Glucose, Bld: 113 mg/dL — ABNORMAL HIGH (ref 70–99)
Potassium: 4.3 meq/L (ref 3.5–5.1)
Sodium: 138 meq/L (ref 135–145)
Total Bilirubin: 0.8 mg/dL (ref 0.2–1.2)
Total Protein: 6.7 g/dL (ref 6.0–8.3)

## 2023-02-15 LAB — TSH: TSH: 1.42 u[IU]/mL (ref 0.35–5.50)

## 2023-02-15 LAB — CBC
HCT: 39.5 % (ref 39.0–52.0)
Hemoglobin: 13.1 g/dL (ref 13.0–17.0)
MCHC: 33.1 g/dL (ref 30.0–36.0)
MCV: 100 fL (ref 78.0–100.0)
Platelets: 329 10*3/uL (ref 150.0–400.0)
RBC: 3.95 Mil/uL — ABNORMAL LOW (ref 4.22–5.81)
RDW: 13.4 % (ref 11.5–15.5)
WBC: 7.6 10*3/uL (ref 4.0–10.5)

## 2023-02-15 LAB — PSA, MEDICARE: PSA: 1.48 ng/mL (ref 0.10–4.00)

## 2023-02-15 LAB — HEMOGLOBIN A1C: Hgb A1c MFr Bld: 5.8 % (ref 4.6–6.5)

## 2023-02-15 LAB — LIPID PANEL
Cholesterol: 219 mg/dL — ABNORMAL HIGH (ref 0–200)
HDL: 49.8 mg/dL (ref >39.00)
LDL Cholesterol: 135 mg/dL — ABNORMAL HIGH (ref 0–99)
NonHDL: 169.51
Total CHOL/HDL Ratio: 4
Triglycerides: 171 mg/dL — ABNORMAL HIGH (ref 0.0–149.0)
VLDL: 34.2 mg/dL (ref 0.0–40.0)

## 2023-02-15 LAB — HEPATITIS C ANTIBODY: Hepatitis C Ab: NONREACTIVE

## 2023-04-27 ENCOUNTER — Ambulatory Visit: Payer: Medicare Other

## 2023-08-15 ENCOUNTER — Ambulatory Visit: Payer: Medicare Other | Admitting: Nurse Practitioner

## 2023-11-17 ENCOUNTER — Other Ambulatory Visit: Payer: Self-pay | Admitting: Nurse Practitioner

## 2023-11-17 DIAGNOSIS — I10 Essential (primary) hypertension: Secondary | ICD-10-CM

## 2023-11-17 NOTE — Telephone Encounter (Signed)
 Can we get patient scheduled ofr CPE on 02/15/2024 or a week or two after.

## 2023-11-18 NOTE — Telephone Encounter (Signed)
 Lvm to schedule for CPE on 02/15/2024 or a week or two after.

## 2023-11-24 NOTE — Telephone Encounter (Signed)
 Lvm to schedule

## 2023-11-29 ENCOUNTER — Telehealth: Payer: Self-pay

## 2023-11-29 NOTE — Telephone Encounter (Signed)
 Apologize for the delay. It was noted on a chest xray in 2015. His lungs were hyperinflated which is consistent with COPD (lung disease)

## 2023-11-29 NOTE — Telephone Encounter (Signed)
 Copied from CRM [id number]. Topic: General - Other >> Nov 29, 2023 11:58 AM Mesmerise C wrote: Reason for CRM: Patient stated when last in office spoke to Dr. Wendee and was told there was a lung disease showing in his medical history and is incorrect stated that Dr. Wendee stated he would take a look and see what's going on and follow up with the patient but didn't hear back patient can be reached at [phone]

## 2023-11-29 NOTE — Telephone Encounter (Signed)
 Left detailed voicemail for patient to call the office back if he has any questions regarding COPD

## 2024-02-22 ENCOUNTER — Other Ambulatory Visit: Payer: Self-pay | Admitting: Nurse Practitioner

## 2024-02-22 ENCOUNTER — Ambulatory Visit

## 2024-02-22 DIAGNOSIS — I10 Essential (primary) hypertension: Secondary | ICD-10-CM

## 2024-03-21 ENCOUNTER — Telehealth: Payer: Self-pay | Admitting: Nurse Practitioner

## 2024-03-21 ENCOUNTER — Ambulatory Visit: Admitting: Nurse Practitioner

## 2024-03-21 VITALS — BP 130/82 | HR 97 | Temp 97.9°F | Ht 70.75 in | Wt 279.0 lb

## 2024-03-21 DIAGNOSIS — Z125 Encounter for screening for malignant neoplasm of prostate: Secondary | ICD-10-CM | POA: Diagnosis not present

## 2024-03-21 DIAGNOSIS — H353113 Nonexudative age-related macular degeneration, right eye, advanced atrophic without subfoveal involvement: Secondary | ICD-10-CM

## 2024-03-21 DIAGNOSIS — R5383 Other fatigue: Secondary | ICD-10-CM | POA: Diagnosis not present

## 2024-03-21 DIAGNOSIS — I10 Essential (primary) hypertension: Secondary | ICD-10-CM | POA: Diagnosis not present

## 2024-03-21 DIAGNOSIS — Z126 Encounter for screening for malignant neoplasm of bladder: Secondary | ICD-10-CM

## 2024-03-21 DIAGNOSIS — R7303 Prediabetes: Secondary | ICD-10-CM

## 2024-03-21 DIAGNOSIS — Z6839 Body mass index (BMI) 39.0-39.9, adult: Secondary | ICD-10-CM

## 2024-03-21 DIAGNOSIS — R202 Paresthesia of skin: Secondary | ICD-10-CM

## 2024-03-21 DIAGNOSIS — M25571 Pain in right ankle and joints of right foot: Secondary | ICD-10-CM

## 2024-03-21 DIAGNOSIS — H353221 Exudative age-related macular degeneration, left eye, with active choroidal neovascularization: Secondary | ICD-10-CM

## 2024-03-21 DIAGNOSIS — R600 Localized edema: Secondary | ICD-10-CM

## 2024-03-21 DIAGNOSIS — E669 Obesity, unspecified: Secondary | ICD-10-CM | POA: Diagnosis not present

## 2024-03-21 DIAGNOSIS — Z1321 Encounter for screening for nutritional disorder: Secondary | ICD-10-CM

## 2024-03-21 DIAGNOSIS — E7800 Pure hypercholesterolemia, unspecified: Secondary | ICD-10-CM | POA: Diagnosis not present

## 2024-03-21 DIAGNOSIS — H353211 Exudative age-related macular degeneration, right eye, with active choroidal neovascularization: Secondary | ICD-10-CM

## 2024-03-21 LAB — LIPID PANEL
Cholesterol: 210 mg/dL — ABNORMAL HIGH (ref 0–200)
HDL: 35.1 mg/dL — ABNORMAL LOW (ref >39.00)
LDL Cholesterol: 142 mg/dL — ABNORMAL HIGH (ref 0–99)
NonHDL: 174.93
Total CHOL/HDL Ratio: 6
Triglycerides: 166 mg/dL — ABNORMAL HIGH (ref 0.0–149.0)
VLDL: 33.2 mg/dL (ref 0.0–40.0)

## 2024-03-21 LAB — CBC
HCT: 39.5 % (ref 39.0–52.0)
Hemoglobin: 13.3 g/dL (ref 13.0–17.0)
MCHC: 33.7 g/dL (ref 30.0–36.0)
MCV: 97.2 fl (ref 78.0–100.0)
Platelets: 421 K/uL — ABNORMAL HIGH (ref 150.0–400.0)
RBC: 4.07 Mil/uL — ABNORMAL LOW (ref 4.22–5.81)
RDW: 13.6 % (ref 11.5–15.5)
WBC: 8.2 K/uL (ref 4.0–10.5)

## 2024-03-21 LAB — COMPREHENSIVE METABOLIC PANEL WITH GFR
ALT: 22 U/L (ref 0–53)
AST: 23 U/L (ref 0–37)
Albumin: 4.3 g/dL (ref 3.5–5.2)
Alkaline Phosphatase: 74 U/L (ref 39–117)
BUN: 29 mg/dL — ABNORMAL HIGH (ref 6–23)
CO2: 31 meq/L (ref 19–32)
Calcium: 9.9 mg/dL (ref 8.4–10.5)
Chloride: 99 meq/L (ref 96–112)
Creatinine, Ser: 1.21 mg/dL (ref 0.40–1.50)
GFR: 57.66 mL/min — ABNORMAL LOW (ref >60.00)
Glucose, Bld: 103 mg/dL — ABNORMAL HIGH (ref 70–99)
Potassium: 5.6 meq/L — ABNORMAL HIGH (ref 3.5–5.1)
Sodium: 138 meq/L (ref 135–145)
Total Bilirubin: 0.5 mg/dL (ref 0.2–1.2)
Total Protein: 6.9 g/dL (ref 6.0–8.3)

## 2024-03-21 LAB — HEMOGLOBIN A1C: Hgb A1c MFr Bld: 6 % (ref 4.6–6.5)

## 2024-03-21 LAB — VITAMIN B12: Vitamin B-12: 250 pg/mL (ref 211–911)

## 2024-03-21 LAB — TSH: TSH: 2.18 u[IU]/mL (ref 0.35–5.50)

## 2024-03-21 LAB — URINALYSIS, MICROSCOPIC ONLY

## 2024-03-21 LAB — URIC ACID: Uric Acid, Serum: 8.7 mg/dL — ABNORMAL HIGH (ref 4.0–7.8)

## 2024-03-21 LAB — VITAMIN D 25 HYDROXY (VIT D DEFICIENCY, FRACTURES): VITD: 19.24 ng/mL — ABNORMAL LOW (ref 30.00–100.00)

## 2024-03-21 LAB — BRAIN NATRIURETIC PEPTIDE: Pro B Natriuretic peptide (BNP): 110 pg/mL — ABNORMAL HIGH (ref 0.0–100.0)

## 2024-03-21 LAB — PSA, MEDICARE: PSA: 0.88 ng/mL (ref 0.10–4.00)

## 2024-03-21 NOTE — Assessment & Plan Note (Signed)
 History of the same.  Pending lipid panel today

## 2024-03-21 NOTE — Assessment & Plan Note (Signed)
 Followed by Dr. Elner and getting injections.  Continue

## 2024-03-21 NOTE — Assessment & Plan Note (Signed)
History of the same.  Pending A1c today 

## 2024-03-21 NOTE — Patient Instructions (Signed)
 Nice to see you today I will be in touch with the labs once I have them  Follow up with me in 1 year

## 2024-03-21 NOTE — Assessment & Plan Note (Signed)
 Patient currently maintained on losartan -hydrochlorothiazide  100-25 mg daily.  Blood pressure controlled continue medication as prescribed

## 2024-03-21 NOTE — Assessment & Plan Note (Signed)
 Pending TSH, A1c, lipid panel today.

## 2024-03-21 NOTE — Assessment & Plan Note (Signed)
 Multifactorial.  Will check CBC, TSH, vitamin D, B12.  If all certain tests or negative consider sleep study

## 2024-03-21 NOTE — Assessment & Plan Note (Signed)
 Ambiguous in nature.  We will check BNP and uric acid level.  He has been using Tylenol  without great relief

## 2024-03-21 NOTE — Assessment & Plan Note (Signed)
 Intermittent on the right leg.  Will check B12 level along with electrolytes and CBC

## 2024-03-21 NOTE — Assessment & Plan Note (Signed)
 Followed by Dr. Elner.  Getting injections continue

## 2024-03-21 NOTE — Telephone Encounter (Signed)
 Patient questioned the dx of COPD. He had a chest xray done in 2015 that showed hyperinflated lungs consistent with COPD. I am happy to print that report off to him. He can also see a pulmonologist and have a pulmonary function test to either confirm or deny the dx

## 2024-03-21 NOTE — Assessment & Plan Note (Signed)
 Already on a diuretic.  In the setting of hypertension will check BNP

## 2024-03-21 NOTE — Progress Notes (Signed)
 Established Patient Office Visit  Subjective   Patient ID: Juan Reed, male    DOB: May 05, 1946  Age: 78 y.o. MRN: [id number]  Chief Complaint  Patient presents with   Annual Exam    HPI  for complete physical and follow up of chronic conditions.  Macular degeneration: followed by Dr. Elner and gets intraventrial injections every couple mon  HTN: on losartan -hydrochlorothiazide  100-25mg  dialy    Immunizations: -Tetanus: Completed in 2013 -Influenza: refused  -Shingles: refused  -Pneumonia: Completed 2013, refused   Diet: Fair diet. He is eating  1 meal and no snacking. He does water coffee and iced tea  Exercise: No regular exercise.  Eye exam: Completes annually. glasses  Dental exam: Plates, sees them PRN   Colonoscopy: Completed in 2013 does not want to continue b/c age Lung Cancer Screening: NA   PSA: Due  Sleep: he states that he naps all day and stays up at night. Does not feel rested. Unsure if he snores        Review of Systems  Constitutional:  Negative for chills and fever.  Respiratory:  Negative for shortness of breath.   Cardiovascular:  Positive for leg swelling. Negative for chest pain.  Gastrointestinal:  Positive for constipation (he is using doclax). Negative for abdominal pain, blood in stool, diarrhea, nausea and vomiting.       BM  daily   Genitourinary:  Negative for dysuria and hematuria.  Neurological:  Positive for tingling (bilateral lower legs). Negative for headaches.  Psychiatric/Behavioral:  Negative for hallucinations and suicidal ideas.       Objective:     BP 130/82   Pulse 97   Temp 97.9 F (36.6 C) (Oral)   Ht 5' 10.75 (1.797 m)   Wt 279 lb (126.6 kg)   SpO2 97%   BMI 39.19 kg/m  BP Readings from Last 3 Encounters:  03/21/24 130/82  02/14/23 124/78  10/15/17 131/88   Wt Readings from Last 3 Encounters:  03/21/24 279 lb (126.6 kg)  02/14/23 267 lb 9.6 oz (121.4 kg)  10/15/17 259 lb (117.5 kg)   SpO2  Readings from Last 3 Encounters:  03/21/24 97%  02/14/23 98%  10/15/17 97%      Physical Exam Vitals and nursing note reviewed.  Constitutional:      Appearance: Normal appearance.  HENT:     Right Ear: Tympanic membrane, ear canal and external ear normal.     Left Ear: Tympanic membrane, ear canal and external ear normal.     Mouth/Throat:     Mouth: Mucous membranes are moist.     Pharynx: Oropharynx is clear.  Eyes:     Extraocular Movements: Extraocular movements intact.     Pupils: Pupils are equal, round, and reactive to light.  Cardiovascular:     Rate and Rhythm: Normal rate and regular rhythm.     Pulses: Normal pulses.     Heart sounds: Normal heart sounds.  Pulmonary:     Effort: Pulmonary effort is normal.     Breath sounds: Normal breath sounds.  Abdominal:     General: Bowel sounds are normal. There is no distension.     Palpations: There is no mass.     Tenderness: There is no abdominal tenderness.     Hernia: No hernia is present.  Musculoskeletal:     Right lower leg: Edema present.     Left lower leg: Edema present.  Lymphadenopathy:     Cervical: No cervical adenopathy.  Skin:    General: Skin is warm.  Neurological:     General: No focal deficit present.     Mental Status: He is alert.     Deep Tendon Reflexes:     Reflex Scores:      Bicep reflexes are 2+ on the right side and 2+ on the left side.      Patellar reflexes are 2+ on the right side and 2+ on the left side.    Comments: Bilateral upper and lower extremity strength 5/5  Psychiatric:        Mood and Affect: Mood normal.        Behavior: Behavior normal.        Thought Content: Thought content normal.        Judgment: Judgment normal.      No results found for any visits on 03/21/24.    The 10-year ASCVD risk score (Arnett DK, et al., 2019) is: 33.2%    Assessment & Plan:   Problem List Items Addressed This Visit       Cardiovascular and Mediastinum   Exudative  age-related macular degeneration of left eye with active choroidal neovascularization (HCC)   Followed by Dr. Elner.  Getting injections continue      Exudative age-related macular degeneration of right eye with active choroidal neovascularization (HCC)   Followed by Dr. Elner and getting injections.  Continue      Primary hypertension   Patient currently maintained on losartan -hydrochlorothiazide  100-25 mg daily.  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      Elevated LDL cholesterol level   History of the same.  Pending lipid panel today      Relevant Orders   Lipid panel   Prediabetes   History of the same.  Pending A1c today      Relevant Orders   Hemoglobin A1c   Lipid panel   Acute right ankle pain   Ambiguous in nature.  We will check BNP and uric acid level.  He has been using Tylenol  without great relief      Relevant Orders   Uric acid   Fatigue   Multifactorial.  Will check CBC, TSH, vitamin D, B12.  If all certain tests or negative consider sleep study      Relevant Orders   CBC   Comprehensive metabolic panel with GFR   Vitamin B12   VITAMIN D 25 Hydroxy (Vit-D Deficiency, Fractures)   TSH   Bilateral lower extremity edema   Already on a diuretic.  In the setting of hypertension will check BNP      Relevant Orders   CBC   Comprehensive metabolic panel with GFR   Brain natriuretic peptide   Paresthesia - Primary   Intermittent on the right leg.  Will check B12 level along with electrolytes and CBC      Relevant Orders   Comprehensive metabolic panel with GFR   Vitamin B12   TSH   Other Visit Diagnoses       Screening for prostate cancer        Relevant Orders   PSA, Medicare     Screening for  bladder cancer       Relevant Orders   Urine Microscopic     Encounter for vitamin deficiency screening       Relevant Orders   VITAMIN D 25 Hydroxy (Vit-D Deficiency, Fractures)     Body mass index (BMI) 39.0-39.9, adult       Relevant Orders   VITAMIN D 25 Hydroxy (Vit-D Deficiency, Fractures)       Return in about 1 year (around 03/21/2025) for CPE and labs .    Adina Crandall, NP

## 2024-03-22 NOTE — Telephone Encounter (Signed)
 Left detailed voicemail for patient to call the office back.

## 2024-03-23 ENCOUNTER — Ambulatory Visit: Payer: Self-pay | Admitting: Nurse Practitioner

## 2024-03-23 DIAGNOSIS — M1000 Idiopathic gout, unspecified site: Secondary | ICD-10-CM

## 2024-03-23 MED ORDER — PREDNISONE 20 MG PO TABS: 40.0000 mg | ORAL_TABLET | Freq: Every day | ORAL | 0 refills | Status: AC

## 2024-05-26 ENCOUNTER — Other Ambulatory Visit: Payer: Self-pay | Admitting: Nurse Practitioner

## 2024-05-26 DIAGNOSIS — I10 Essential (primary) hypertension: Secondary | ICD-10-CM

## 2025-03-21 ENCOUNTER — Ambulatory Visit: Admitting: Nurse Practitioner
